# Patient Record
Sex: Female | Born: 1955 | Race: Black or African American | Hispanic: No | Marital: Single | Smoking: Current every day smoker
Health system: Southern US, Community
[De-identification: ages and names within clinical notes are randomized; demographics above are authoritative.]

## PROBLEM LIST (undated history)

## (undated) DIAGNOSIS — J449 Chronic obstructive pulmonary disease, unspecified: Secondary | ICD-10-CM

## (undated) DIAGNOSIS — J45909 Unspecified asthma, uncomplicated: Secondary | ICD-10-CM

## (undated) DIAGNOSIS — M1990 Unspecified osteoarthritis, unspecified site: Secondary | ICD-10-CM

## (undated) DIAGNOSIS — E119 Type 2 diabetes mellitus without complications: Secondary | ICD-10-CM

## (undated) DIAGNOSIS — I639 Cerebral infarction, unspecified: Secondary | ICD-10-CM

## (undated) DIAGNOSIS — I119 Hypertensive heart disease without heart failure: Secondary | ICD-10-CM

## (undated) DIAGNOSIS — I10 Essential (primary) hypertension: Secondary | ICD-10-CM

## (undated) DIAGNOSIS — I509 Heart failure, unspecified: Secondary | ICD-10-CM

## (undated) DIAGNOSIS — I5042 Chronic combined systolic (congestive) and diastolic (congestive) heart failure: Secondary | ICD-10-CM

## (undated) DIAGNOSIS — R0789 Other chest pain: Secondary | ICD-10-CM

## (undated) HISTORY — PX: SP ARTHRO THUMB*R*: HXRAD215

## (undated) HISTORY — PX: TUBAL LIGATION: SHX77

## (undated) HISTORY — PX: CHOLECYSTECTOMY: SHX55

## (undated) HISTORY — PX: DENTAL SURGERY: SHX609

---

## 2015-09-06 ENCOUNTER — Emergency Department (HOSPITAL_COMMUNITY): Payer: Medicaid Other

## 2015-09-06 ENCOUNTER — Observation Stay (HOSPITAL_COMMUNITY)
Admission: EM | Admit: 2015-09-06 | Discharge: 2015-09-07 | Disposition: A | Discharge status: Home / Self Care | Payer: Medicaid Other | Attending: Internal Medicine | Admitting: Internal Medicine

## 2015-09-06 ENCOUNTER — Encounter (HOSPITAL_COMMUNITY): Payer: Self-pay

## 2015-09-06 DIAGNOSIS — F4310 Post-traumatic stress disorder, unspecified: Secondary | ICD-10-CM | POA: Diagnosis not present

## 2015-09-06 DIAGNOSIS — F319 Bipolar disorder, unspecified: Secondary | ICD-10-CM | POA: Diagnosis not present

## 2015-09-06 DIAGNOSIS — R0789 Other chest pain: Secondary | ICD-10-CM | POA: Diagnosis present

## 2015-09-06 DIAGNOSIS — E119 Type 2 diabetes mellitus without complications: Secondary | ICD-10-CM

## 2015-09-06 DIAGNOSIS — Z79899 Other long term (current) drug therapy: Secondary | ICD-10-CM | POA: Insufficient documentation

## 2015-09-06 DIAGNOSIS — I110 Hypertensive heart disease with heart failure: Secondary | ICD-10-CM | POA: Diagnosis not present

## 2015-09-06 DIAGNOSIS — J449 Chronic obstructive pulmonary disease, unspecified: Secondary | ICD-10-CM | POA: Diagnosis not present

## 2015-09-06 DIAGNOSIS — I119 Hypertensive heart disease without heart failure: Secondary | ICD-10-CM | POA: Diagnosis present

## 2015-09-06 DIAGNOSIS — Z66 Do not resuscitate: Secondary | ICD-10-CM | POA: Diagnosis not present

## 2015-09-06 DIAGNOSIS — R079 Chest pain, unspecified: Principal | ICD-10-CM | POA: Diagnosis present

## 2015-09-06 DIAGNOSIS — F17210 Nicotine dependence, cigarettes, uncomplicated: Secondary | ICD-10-CM | POA: Insufficient documentation

## 2015-09-06 DIAGNOSIS — I69354 Hemiplegia and hemiparesis following cerebral infarction affecting left non-dominant side: Secondary | ICD-10-CM | POA: Insufficient documentation

## 2015-09-06 DIAGNOSIS — I428 Other cardiomyopathies: Secondary | ICD-10-CM | POA: Insufficient documentation

## 2015-09-06 DIAGNOSIS — I5042 Chronic combined systolic (congestive) and diastolic (congestive) heart failure: Secondary | ICD-10-CM | POA: Diagnosis present

## 2015-09-06 DIAGNOSIS — J410 Simple chronic bronchitis: Secondary | ICD-10-CM | POA: Diagnosis not present

## 2015-09-06 DIAGNOSIS — Z6838 Body mass index (BMI) 38.0-38.9, adult: Secondary | ICD-10-CM | POA: Diagnosis not present

## 2015-09-06 DIAGNOSIS — Z590 Homelessness: Secondary | ICD-10-CM | POA: Diagnosis not present

## 2015-09-06 DIAGNOSIS — R9431 Abnormal electrocardiogram [ECG] [EKG]: Secondary | ICD-10-CM | POA: Diagnosis present

## 2015-09-06 DIAGNOSIS — Z794 Long term (current) use of insulin: Secondary | ICD-10-CM | POA: Diagnosis not present

## 2015-09-06 HISTORY — DX: Essential (primary) hypertension: I10

## 2015-09-06 HISTORY — DX: Unspecified osteoarthritis, unspecified site: M19.90

## 2015-09-06 HISTORY — DX: Heart failure, unspecified: I50.9

## 2015-09-06 HISTORY — DX: Chronic combined systolic (congestive) and diastolic (congestive) heart failure: I50.42

## 2015-09-06 HISTORY — DX: Hypertensive heart disease without heart failure: I11.9

## 2015-09-06 HISTORY — DX: Unspecified asthma, uncomplicated: J45.909

## 2015-09-06 HISTORY — DX: Chronic obstructive pulmonary disease, unspecified: J44.9

## 2015-09-06 HISTORY — DX: Other chest pain: R07.89

## 2015-09-06 HISTORY — DX: Cerebral infarction, unspecified: I63.9

## 2015-09-06 HISTORY — DX: Type 2 diabetes mellitus without complications: E11.9

## 2015-09-06 LAB — RAPID URINE DRUG SCREEN, HOSP PERFORMED
Amphetamines: NOT DETECTED
Barbiturates: NOT DETECTED
Benzodiazepines: NOT DETECTED
Cocaine: NOT DETECTED
OPIATES: NOT DETECTED
Tetrahydrocannabinol: POSITIVE — AB

## 2015-09-06 LAB — CBC
HEMATOCRIT: 38.1 % (ref 36.0–46.0)
HEMATOCRIT: 37.1 % (ref 36.0–46.0)
Hemoglobin: 12.6 g/dL (ref 12.0–15.0)
Hemoglobin: 12.2 g/dL (ref 12.0–15.0)
MCH: 27.4 pg (ref 26.0–34.0)
MCH: 27.7 pg (ref 26.0–34.0)
MCHC: 33.1 g/dL (ref 30.0–36.0)
MCHC: 32.9 g/dL (ref 30.0–36.0)
MCV: 82.8 fL (ref 78.0–100.0)
MCV: 84.3 fL (ref 78.0–100.0)
Platelets: 267 10*3/uL (ref 150–400)
Platelets: 242 10*3/uL (ref 150–400)
RBC: 4.6 MIL/uL (ref 3.87–5.11)
RBC: 4.4 MIL/uL (ref 3.87–5.11)
RDW: 15 % (ref 11.5–15.5)
RDW: 15.2 % (ref 11.5–15.5)
WBC: 6.8 10*3/uL (ref 4.0–10.5)
WBC: 6.2 10*3/uL (ref 4.0–10.5)

## 2015-09-06 LAB — APTT: APTT: 30 s (ref 24–37)

## 2015-09-06 LAB — BASIC METABOLIC PANEL
ANION GAP: 8 (ref 5–15)
BUN: 13 mg/dL (ref 6–20)
CHLORIDE: 109 mmol/L (ref 101–111)
CO2: 22 mmol/L (ref 22–32)
Calcium: 9.4 mg/dL (ref 8.9–10.3)
Creatinine, Ser: 0.78 mg/dL (ref 0.44–1.00)
Glucose, Bld: 212 mg/dL — ABNORMAL HIGH (ref 65–99)
POTASSIUM: 3.4 mmol/L — AB (ref 3.5–5.1)
SODIUM: 139 mmol/L (ref 135–145)

## 2015-09-06 LAB — GLUCOSE, CAPILLARY: GLUCOSE-CAPILLARY: 165 mg/dL — AB (ref 65–99)

## 2015-09-06 LAB — CREATININE, SERUM
Creatinine, Ser: 1.05 mg/dL — ABNORMAL HIGH (ref 0.44–1.00)
GFR calc non Af Amer: 57 mL/min — ABNORMAL LOW (ref >60)

## 2015-09-06 LAB — BRAIN NATRIURETIC PEPTIDE: B NATRIURETIC PEPTIDE 5: 264.3 pg/mL — AB (ref 0.0–100.0)

## 2015-09-06 LAB — I-STAT TROPONIN, ED: TROPONIN I, POC: 0 ng/mL (ref 0.00–0.08)

## 2015-09-06 LAB — TROPONIN I: Troponin I: <0.03 ng/mL (ref <0.03)

## 2015-09-06 MED ORDER — RISPERIDONE 0.25 MG PO TABS
0.2500 mg | ORAL_TABLET | Freq: Every day | ORAL | Status: DC
  Administered 2015-09-06 – 2015-09-07 (×2): 0.25 mg via ORAL
  Filled 2015-09-06 (×2): qty 1

## 2015-09-06 MED ORDER — ACETAMINOPHEN 325 MG PO TABS: 650.0000 mg | ORAL_TABLET | ORAL | Status: DC | PRN

## 2015-09-06 MED ORDER — ENOXAPARIN SODIUM 40 MG/0.4ML ~~LOC~~ SOLN
40.0000 mg | SUBCUTANEOUS | Status: DC
  Administered 2015-09-06: 40 mg via SUBCUTANEOUS
  Filled 2015-09-06: qty 0.4

## 2015-09-06 MED ORDER — ATORVASTATIN CALCIUM 20 MG PO TABS
20.0000 mg | ORAL_TABLET | Freq: Every evening | ORAL | Status: DC
  Administered 2015-09-06: 20 mg via ORAL
  Filled 2015-09-06: qty 1

## 2015-09-06 MED ORDER — PANTOPRAZOLE SODIUM 40 MG PO TBEC
40.0000 mg | DELAYED_RELEASE_TABLET | Freq: Every day | ORAL | Status: DC
  Administered 2015-09-06 – 2015-09-07 (×2): 40 mg via ORAL
  Filled 2015-09-06 (×2): qty 1

## 2015-09-06 MED ORDER — ACETAMINOPHEN 325 MG PO TABS
650.0000 mg | ORAL_TABLET | Freq: Once | ORAL | Status: AC
  Administered 2015-09-06: 650 mg via ORAL
  Filled 2015-09-06: qty 2

## 2015-09-06 MED ORDER — CARVEDILOL 3.125 MG PO TABS
3.1250 mg | ORAL_TABLET | Freq: Two times a day (BID) | ORAL | Status: DC
  Administered 2015-09-07: 3.125 mg via ORAL
  Filled 2015-09-06: qty 1

## 2015-09-06 MED ORDER — AMITRIPTYLINE HCL 50 MG PO TABS
50.0000 mg | ORAL_TABLET | Freq: Every day | ORAL | Status: DC
  Administered 2015-09-06: 50 mg via ORAL
  Filled 2015-09-06: qty 1

## 2015-09-06 MED ORDER — MECLIZINE HCL 25 MG PO TABS: 25.0000 mg | ORAL_TABLET | Freq: Three times a day (TID) | ORAL | Status: DC | PRN

## 2015-09-06 MED ORDER — AMLODIPINE BESYLATE 5 MG PO TABS
5.0000 mg | ORAL_TABLET | Freq: Every day | ORAL | Status: DC
Start: 2015-09-06 — End: 2015-09-07
  Administered 2015-09-06 – 2015-09-07 (×2): 5 mg via ORAL
  Filled 2015-09-06 (×2): qty 1

## 2015-09-06 MED ORDER — ONDANSETRON HCL 4 MG/2ML IJ SOLN: 4.0000 mg | Freq: Four times a day (QID) | INTRAMUSCULAR | Status: DC | PRN

## 2015-09-06 MED ORDER — FUROSEMIDE 40 MG PO TABS
40.0000 mg | ORAL_TABLET | Freq: Every day | ORAL | Status: DC
  Administered 2015-09-06 – 2015-09-07 (×2): 40 mg via ORAL
  Filled 2015-09-06 (×2): qty 1

## 2015-09-06 MED ORDER — INSULIN ASPART 100 UNIT/ML ~~LOC~~ SOLN
0.0000 [IU] | Freq: Three times a day (TID) | SUBCUTANEOUS | Status: DC
  Administered 2015-09-07: 3 [IU] via SUBCUTANEOUS

## 2015-09-06 MED ORDER — LISINOPRIL 10 MG PO TABS
20.0000 mg | ORAL_TABLET | Freq: Every day | ORAL | Status: DC
Start: 2015-09-06 — End: 2015-09-07
  Administered 2015-09-06 – 2015-09-07 (×2): 20 mg via ORAL
  Filled 2015-09-06 (×2): qty 2

## 2015-09-06 MED ORDER — ALBUTEROL SULFATE (2.5 MG/3ML) 0.083% IN NEBU: 2.5000 mg | INHALATION_SOLUTION | Freq: Four times a day (QID) | RESPIRATORY_TRACT | Status: DC | PRN

## 2015-09-06 MED ORDER — INSULIN DETEMIR 100 UNIT/ML ~~LOC~~ SOLN
20.0000 [IU] | Freq: Every day | SUBCUTANEOUS | Status: DC
  Administered 2015-09-06: 20 [IU] via SUBCUTANEOUS
  Filled 2015-09-06 (×2): qty 0.2

## 2015-09-06 MED ORDER — INSULIN ASPART 100 UNIT/ML ~~LOC~~ SOLN: 0.0000 [IU] | Freq: Every day | SUBCUTANEOUS | Status: DC

## 2015-09-06 MED ORDER — GLIPIZIDE 5 MG PO TABS
5.0000 mg | ORAL_TABLET | Freq: Every day | ORAL | Status: DC
  Administered 2015-09-07: 5 mg via ORAL
  Filled 2015-09-06: qty 1

## 2015-09-06 MED ORDER — MONTELUKAST SODIUM 10 MG PO TABS
10.0000 mg | ORAL_TABLET | Freq: Every day | ORAL | Status: DC
Start: 2015-09-06 — End: 2015-09-07
  Administered 2015-09-06: 10 mg via ORAL
  Filled 2015-09-06: qty 1

## 2015-09-06 MED ORDER — TRAMADOL HCL 50 MG PO TABS
100.0000 mg | ORAL_TABLET | Freq: Four times a day (QID) | ORAL | Status: DC | PRN
  Administered 2015-09-06 – 2015-09-07 (×2): 100 mg via ORAL
  Filled 2015-09-06 (×2): qty 2

## 2015-09-06 MED ORDER — POTASSIUM CHLORIDE CRYS ER 20 MEQ PO TBCR
40.0000 meq | EXTENDED_RELEASE_TABLET | Freq: Once | ORAL | Status: AC
  Administered 2015-09-06: 40 meq via ORAL
  Filled 2015-09-06: qty 2

## 2015-09-06 MED ORDER — PAROXETINE HCL 20 MG PO TABS
20.0000 mg | ORAL_TABLET | Freq: Every day | ORAL | Status: DC
Start: 2015-09-06 — End: 2015-09-07
  Administered 2015-09-06 – 2015-09-07 (×2): 20 mg via ORAL
  Filled 2015-09-06 (×2): qty 1

## 2015-09-06 NOTE — Progress Notes (Addendum)
Medicaid [place] access response hx indicates the assigned pcp is [facility] [address] [place], [place] [address] [phone] Entered in d/c instructions  [facility] Schedule an appointment as soon as possible for a visit As needed This is your assigned Medicaid [place] access doctor If you prefer to see another Medicaid doctor other than the one on your Medicaid card PLEASE CALL DSS [phone] or [phone] Medicaid  access response hx indicates the assigned pcp is [facility] [address] [place], [place] [address] [phone]  Sandi Mariscal access patient [place]: [phone] [address] [place], [place] [address] [email or web address] Use this website to assist with understanding your coverage & to renew application As a Medicaid client you MUST contact DSS/SSI each time you change address, move to another [place] county or another state to keep your address updated  [place] Medicaid Transportation to Dr appts if you are have full Medicaid: [phone], [phone]

## 2015-09-06 NOTE — Progress Notes (Signed)
CSW spoke with pt re: homelessness issues.  Per pt, she has been in contact with her [facility] Case Manager, Herbert Seta, who has referred her to a "transitional housing shelter" operated by [employer], and that a bed is available to her at d/c.  Per pt, she plans on securing her own rental property next month, and only needs somewhere to stay for "a couple of weeks." If Mr. Laverle Patter does not have a bed for pt at d/c, she will gladly accept housing at a homeless shelter.  Shelter list provided. CSW will continue to follow for disposition and assist as necessary.  Emotional support provided to pt.

## 2015-09-06 NOTE — Progress Notes (Signed)
Patient received from ED and oriented to room and equipment. Tele monitoring called in, call light within reach

## 2015-09-06 NOTE — ED Notes (Signed)
Admitting provider at bedside, pt is having pain, pain is reproduced with movement of arms and movement with body, admitting provider deemed pain was musculoskeletal.

## 2015-09-06 NOTE — Consult Note (Signed)
CARDIOLOGY CONSULT NOTE   Patient ID: Kayla Powell MRN: [id number], DOB/AGE: 1955/08/08   Admit date: 09/06/2015 Date of Consult: 09/06/2015   Primary Physician: she has not been set up with a PCP since moved to [facility] 2 weeks ago Primary Cardiologist: new   Pt. 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 COPD (chronic obstructive pulmonary disease) (HCC)   . Stroke (HCC)   . Diabetes mellitus without complication (HCC)   . Hypertension   . CHF (congestive heart failure) (HCC)   . Arthritis   . Asthma     Past Surgical History  Procedure Laterality Date  . Sp arthro thumb*r*    . Cholecystectomy    . Tubal ligation    . Dental surgery       Allergies  Allergies  Allergen Reactions  . 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 Per outside record, she had a cardiac catheterization in April 2016 that showed normal coronaries. She thinks she had may have had a stroke back in September 2016, however did not seek medical attention at that time, she was diagnosed with a second stroke in November 2016 and has been having decreased sensation and paralysis of the left upper and lower extremity since then. She has been admitted 4 times in the past year at [facility] in [place]. She had a history of cocaine abuse which she says she has quit and has been clean for the past 2 month.   Her most recent admission at [facility] was in March and May for acute on chronic systolic heart failure. She is currently on 40 mg  daily oral Lasix. During both hospitalization, she had CTA scan both times for falsely elevated d-dimer, and both CTA of chest (on 3/20 and 5/11) were negative. Echocardiogram obtained in March 2017 showed EF 30%, moderate pulmonary hypertension, mild MR. She did have a myocardial perfusion stress test on 07/06/2015 which showed EF 11%, overall poor study, cannot exclude ischemia in the apical septal and anteroseptal region. During his hospitalization in May, her initial proBNP was > 6000, apparently she also had a repeat echocardiogram on 5/17 which showed EF 30%, although such record was not available. She was complaining of chest pain as a time and was instructed to avoid cocaine. He was treated with diuretic and also bacteremia. Blood culture was positive for Bordetella in one set and Corynebacter in second set of blood culture. She also had EGD with biopsy that tested positive for candidiasis of esophagus. She was supposed to followup with cardiology as outside, but she says "they never set me up with a cardiologist".  She says she has been compliant with her medication, she moved to the [place] area 2 weeks ago to be closer to her son. She is adamant she has stopped using cocaine for the past 2 month. She also has been cutting back on smoking, currently only smokes 3-4 cigarettes per day compared to a pack a day earlier this year. She was  doing well until 3 days ago when she started having substernal chest discomfort which she described as a constant dull pressure sensation like someone was jumping up and down on her chest. It is accompanied by shortness of breath. She is also noticed increasing lower extremity edema. She finally decided to seek medical attention today. Chest x-ray was negative. BNP was 264.3. Serial troponin was negative 2. Potassium 3.4. Cardiology has been consulted for chest pain. On physical exam, her chest pain is worse with palpation and body rotation especially when she is leaning  on her right side.   Family History Family History  Problem Relation Age of Onset  . Heart failure Mother   . Hypertension Mother   . Cancer Mother   . Cancer Father   . Hypertension Father   . Stroke Brother   . Diabetes Brother   . Heart failure Brother      Social History Social History   Social History  . Marital Status: Single    Spouse Name: N/A  . Number of Children: N/A  . Years of Education: N/A   Occupational History  . Not on file.   Social History Main Topics  . Smoking status: Current Every Day Smoker    Types: Cigarettes  . Smokeless tobacco: Not on file  . Alcohol Use: Yes     Comment: occasional  . Drug Use: Yes    Special: Marijuana  . Sexual Activity: Not on file   Other Topics Concern  . Not on file   Social History Narrative  . No narrative on file     Review of Systems  General:  No chills, fever, night sweats or weight changes.  Cardiovascular:  No orthopnea, palpitations, paroxysmal nocturnal dyspnea. +chest pain, dyspnea on exertion, edema Dermatological: No rash, lesions/masses Respiratory: No cough +dyspnea Urologic: No hematuria, dysuria Abdominal:   No nausea, vomiting, diarrhea, bright red blood per rectum, melena, or hematemesis Neurologic:  No visual changes, wkns, changes in mental status. All other systems reviewed and are otherwise negative except as noted above.  Physical Exam  Blood pressure 119/78, pulse 77, resp. rate 19, SpO2 100 %.  General: Pleasant, NAD Psych: Normal affect. Neuro: Alert and oriented X 3. Moves all extremities spontaneously. HEENT: Normal  Neck: Supple without bruits or JVD. Lungs:  Resp regular and unlabored, CTA. Heart: RRR no s3, s4, or murmurs. Abdomen: Soft, non-tender, non-distended, BS + x 4.  Extremities: No clubbing, cyanosis or edema. DP/PT/Radials 2+ and equal bilaterally.  Labs   Recent Labs  09/06/15 1355  TROPONINI <0.03   Lab Results  Component Value Date   WBC 6.8  09/06/2015   HGB 12.6 09/06/2015   HCT 38.1 09/06/2015   MCV 82.8 09/06/2015   PLT 267 09/06/2015    Recent Labs Lab 09/06/15 1000  NA 139  K 3.4*  CL 109  CO2 22  BUN 13  CREATININE 0.78  CALCIUM 9.4  GLUCOSE 212*   No results found for: CHOL, HDL, LDLCALC, TRIG No results found for: DDIMER  Radiology/Studies  Dg Chest 2 View  09/06/2015  CLINICAL DATA:  Chest pain, cold chills since Tuesday EXAM: CHEST  2 VIEW COMPARISON:  None. FINDINGS: Cardiomegaly. The lingular density, likely atelectasis or scarring. Right lung is clear. No visible effusions. No acute bony abnormality. Degenerative changes in the thoracic spine. IMPRESSION: Cardiomegaly.  Lingular atelectasis or scarring. Electronically Signed   By: Charlett Nose M.D.   On: 09/06/2015 10:54    ECG  NSR with incomplete LBBB  ASSESSMENT AND PLAN  1. Chest pain: relatively lower suspicion for cardiac chest pain, although she did have a case her candidiasis of esophagus on EGD in May. Her pain also worse with palpation and body rotation  - she has 2 admission in March and May, both had falsely elevated d-dimer and negative CTA of chest. D-dimer will not be helpful  - will set this patient up with cardiology service here  2. NICM/Chronic systolic HF with baseline EF 30%: BNP minimally elevated > 200, but her proBNP was > 6000 on previous admission in May  - despite her claim that she is fluid overloaded, i do not see it on physical exam, she appears euvolemic  3. HTN  4. DM II  5. COPD  6. 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 COPD, no sign of acute exacerbation  8. h/o cocaine use: she is adamant she has quit cocaine for the past 2 month, only marijuana use   Signed, Azalee Course, PA-C 09/06/2015, 3:48 PM

## 2015-09-06 NOTE — ED Notes (Signed)
Ordered diet tray 

## 2015-09-06 NOTE — ED Provider Notes (Signed)
CSN: [id number]     Arrival date & time 09/06/15  0944 History   First MD Initiated Contact with Patient 09/06/15 1015     Chief Complaint  Patient presents with  . Chest Pain     (Consider location/radiation/quality/duration/timing/severity/associated sxs/prior Treatment) HPI Comments: Kayla Powell is a 60 y.o. female with h/o T2DM, COPD, HTN, CHF, asthma, PTSD, and CVA with residual partial left sided paralysis presents to ED via EMS with complaint of chest pain. Pain is left sided and started approximately 3 days ago and has progressively intensified. Pain 10/10 initially. She was given 324 ASA and 2 NTG with improvement in pain to a 7/10. She describes it as a dull, constant throbbing with intermittent sharp pain. No radiation. Worse when she lays on her right side stating "I can't breathe" and improved when laying on left side.  Associated shortness of breath, dry cough, leg swelling, palpitations, dizziness, nausea, diaphoresis, and chills. Denies history of MI. No recent long distance travel/surgery/immobilization, no h/o cancer, no hemoptysis, no leg pain, no unilateral leg swelling. She is not on home O2. Endorses taking her medications as prescribed and denies dietary indiscretion. Last hospitalization was in march 2017 for similar sxs. She received most of her care in [facility].   Patient is a 60 y.o. female presenting with chest pain. The history is provided by the patient.  Chest Pain Associated symptoms: cough, diaphoresis, dizziness, nausea, numbness ( chronic left sided partial paralysis), palpitations, shortness of breath and weakness ( chronic, partial left sided partial paralysis )   Associated symptoms: no abdominal pain, no fever and not vomiting     Past Medical History  Diagnosis Date  . COPD (chronic obstructive pulmonary disease) (HCC)   . Stroke (HCC)   . Diabetes mellitus without complication (HCC)   . Hypertension   . CHF (congestive heart failure) (HCC)   .  Arthritis   . Asthma   . Chronic combined systolic and diastolic heart failure (HCC) 09/06/2015  . Hypertensive heart disease 09/06/2015  . Atypical chest pain 09/06/2015   Past Surgical History  Procedure Laterality Date  . Sp arthro thumb*r*    . Cholecystectomy    . Tubal ligation    . Dental surgery     Family History  Problem Relation Age of Onset  . Heart failure Mother   . Hypertension Mother   . Cancer Mother   . Cancer Father   . Hypertension Father   . Stroke Brother   . Diabetes Brother   . Heart failure Brother    Social History  Substance Use Topics  . Smoking status: Current Every Day Smoker    Types: Cigarettes  . Smokeless tobacco: None  . Alcohol Use: Yes     Comment: occasional   OB History    No data available     Review of Systems  Constitutional: Positive for chills and diaphoresis. Negative for fever.  HENT: Positive for sore throat.   Respiratory: Positive for cough and shortness of breath. Choking:  dry.   Cardiovascular: Positive for chest pain, palpitations and leg swelling ( b/l).  Gastrointestinal: Positive for nausea and constipation. Negative for vomiting, abdominal pain and diarrhea.  Genitourinary: Negative for dysuria and hematuria.  Musculoskeletal: Positive for arthralgias ( h/o arthritis).  Skin: Negative for rash.  Neurological: Positive for dizziness, tremors, weakness ( chronic, partial left sided partial paralysis ) and numbness ( chronic left sided partial paralysis). Negative for seizures.  Psychiatric/Behavioral: The patient is not  nervous/anxious.       Allergies  Ppd  Home Medications   Prior to Admission medications   Medication Sig Start Date End Date Taking? Authorizing Provider  albuterol (PROVENTIL HFA;VENTOLIN HFA) 108 (90 Base) MCG/ACT inhaler Inhale 1-2 puffs into the lungs every 6 (six) hours as needed for wheezing or shortness of breath.   Yes Historical Provider, MD  amitriptyline (ELAVIL) 50 MG tablet  Take 50 mg by mouth at bedtime.   Yes Historical Provider, MD  amLODipine (NORVASC) 5 MG tablet Take 5 mg by mouth daily.   Yes Historical Provider, MD  atorvastatin (LIPITOR) 20 MG tablet Take 20 mg by mouth every evening.   Yes Historical Provider, MD  baclofen (LIORESAL) 20 MG tablet Take 20 mg by mouth 3 (three) times daily.   Yes Historical Provider, MD  carvedilol (COREG) 3.125 MG tablet Take 3.125 mg by mouth 2 (two) times daily with a meal.   Yes Historical Provider, MD  furosemide (LASIX) 40 MG tablet Take 40 mg by mouth daily.   Yes Historical Provider, MD  glipiZIDE (GLUCOTROL) 5 MG tablet Take 5 mg by mouth daily before breakfast.   Yes Historical Provider, MD  insulin detemir (LEVEMIR) 100 UNIT/ML injection Inject 20 Units into the skin at bedtime.   Yes Historical Provider, MD  lisinopril (PRINIVIL,ZESTRIL) 20 MG tablet Take 20 mg by mouth daily.   Yes Historical Provider, MD  meclizine (ANTIVERT) 25 MG tablet Take 25 mg by mouth 3 (three) times daily as needed for dizziness.   Yes Historical Provider, MD  montelukast (SINGULAIR) 10 MG tablet Take 10 mg by mouth at bedtime.   Yes Historical Provider, MD  pantoprazole (PROTONIX) 40 MG tablet Take 40 mg by mouth daily.   Yes Historical Provider, MD  PARoxetine (PAXIL) 20 MG tablet Take 20 mg by mouth daily.   Yes Historical Provider, MD  risperiDONE (RISPERDAL) 0.25 MG tablet Take 0.25 mg by mouth daily.   Yes Historical Provider, MD   BP 114/69 mmHg  Pulse 75  Resp 24  SpO2 100% Physical Exam  Constitutional: She appears well-developed and well-nourished. She appears distressed.  HENT:  Head: Normocephalic and atraumatic.  Mouth/Throat: Uvula is midline and oropharynx is clear and moist. No trismus in the jaw. No uvula swelling. No oropharyngeal exudate.  Eyes: Conjunctivae and EOM are normal. Pupils are equal, round, and reactive to light. Right eye exhibits no discharge. Left eye exhibits no discharge. No scleral icterus.   Neck: Phonation normal. Neck supple. No JVD present. Decreased range of motion present.  Cardiovascular: Normal rate, regular rhythm, normal heart sounds and intact distal pulses.   No murmur heard. Pulmonary/Chest: No accessory muscle usage or stridor. Tachypnea noted. No respiratory distress. She has decreased breath sounds ( diffuse). She has no wheezes. She has no rhonchi. She has no rales. She exhibits no tenderness and no bony tenderness.  Abdominal: Soft. Bowel sounds are normal. There is no tenderness. There is no rebound and no guarding.  Musculoskeletal: She exhibits no edema or tenderness.  Left sided upper extremity contractures.   Lymphadenopathy:    She has no cervical adenopathy.  Neurological: She is alert.  Decrease strength in left lower extremity, per pt chronic secondary to partial paralysis.   Skin: Skin is warm and dry. She is not diaphoretic.  Psychiatric: She has a normal mood and affect. Her behavior is normal.    ED Course  Procedures (including critical care time) Labs Review Labs Reviewed  BASIC  METABOLIC PANEL - Abnormal; Notable for the following:    Potassium 3.4 (*)    Glucose, Bld 212 (*)    All other components within normal limits  BRAIN NATRIURETIC PEPTIDE - Abnormal; Notable for the following:    B Natriuretic Peptide 264.3 (*)    All other components within normal limits  APTT  CBC  TROPONIN I  I-STAT TROPOININ, ED    Imaging Review Dg Chest 2 View  09/06/2015  CLINICAL DATA:  Chest pain, cold chills since Tuesday EXAM: CHEST  2 VIEW COMPARISON:  None. FINDINGS: Cardiomegaly. The lingular density, likely atelectasis or scarring. Right lung is clear. No visible effusions. No acute bony abnormality. Degenerative changes in the thoracic spine. IMPRESSION: Cardiomegaly.  Lingular atelectasis or scarring. Electronically Signed   By: Charlett Nose M.D.   On: 09/06/2015 10:54   I have personally reviewed and evaluated these images and lab results as  part of my medical decision-making.   EKG Interpretation   Date/Time:  Friday September 06 2015 09:52:48 EDT Ventricular Rate:  77 PR Interval:    QRS Duration: 149 QT Interval:  484 QTC Calculation: 548 R Axis:   -81 Text Interpretation:  Sinus rhythm Probable left atrial enlargement  Nonspecific IVCD with LAD Left ventricular hypertrophy No previous tracing  Confirmed by BEATON  MD, ROBERT ([id number]) on 09/06/2015 10:27:45 AM     Filed Vitals:   09/06/15 1900 09/06/15 1917 09/06/15 1931 09/06/15 1931  BP: 114/72 105/76 112/70   Pulse:  77 72   Temp:    97.6 F (36.4 C)  TempSrc:    Oral  Resp: 24 16 17    SpO2:  100% 100%     MDM   Final diagnoses:  None   Patient is afebrile and non-toxic appearing in NAD. She is tachypneic, vital signs otherwise stable. Physical exam remarkable for decrease breath sounds diffuse. Received records from [place] [facility]. Pt recently had cardiac work up in March and May 2017. Echo showed EF ~30% with evidence of mild pulmonary HTN. Nuclear stress test in May 2017 showed global left ventricular systolic function with reduced EF of 11%. EKG shows sinus rhythm with left atrial enlargement and LVH. Initial troponin negative. CXR remarkable for cardiomegaly, lingular density - question of atelectasis vs. Scarring. BMP, CBC, aPTT unremarkable.  BNP mildly elevated at 264; however, compared to previous admission of BNP >5000 - questionable whether this is CHF exacerbation. Given history and heart score of 6, consult to cardiology.   2:10 PM: Consult to cardiology, appreciate their time. Agree to see patient.   ~5:00 PM: Spoke with Azalee Course, cardiology PA, appreciated his time and input. Further records show normal cardiac cath in 2016. Await attending input for dispo.   6:09 PM: Spoke with Nada Boozer, NP cardiology, appreciated her time. Consult to hospitalist for admission for cardiac r/o.   7:27 PM: Spoke with [facility], appreciate their time and input. Will admit  patient for cardiac rule out.   Lona Kettle, [place] 09/06/15 1935  Nelva Nay, MD 09/08/15 [phone]

## 2015-09-06 NOTE — ED Notes (Signed)
Pt also c/o shaking at left upper and lower extremities. Shaking intermittent but pt states this is new.

## 2015-09-06 NOTE — ED Notes (Signed)
Pt staes she is feeling better and no tremor is noted at left side of bottom.

## 2015-09-06 NOTE — ED Notes (Addendum)
Pt arrives EMS with c/o chest pain after walking longer diastance. Pt states chest pain stated 3 days ago but milder. Also c/o shob. Given 2 nitro  And aspirin 324 by EMS.

## 2015-09-06 NOTE — H&P (Signed)
Kayla Powell:[id number] DOB: 1955/03/15 DOA: 09/06/2015     PCP: PROVIDER NOT IN SYSTEM  Recently moved from [facility] Outpatient Specialists: none   Patient coming from: homeless    Chief Complaint: chest pain  HPI: Kayla Powell is a 60 y.o. female with medical history significant of asthma/COPD, bipolar disorder, combined systolic and diastolic heart failure, depression, diabetes mellitus type 2, hypertension posttraumatic stress disorder and stroke 2 with residual left-sided paralysis  Left bundle-branch block, cocaine abuse  Presented with chest pain after walking for long distance. She have had similar episode 3 days ago but there was mild. She called EMS and was given 2 nitroglycerin and aspirin 324 in  Route. Pain was associated shortness of breath and coughing she describes it as dull and constant occasionally throb and sometimes becomes sharp. Chest pain is in the center. Reports pain is worse with palpation and worse with moving her left arm Reports last cocaine use 2 months ago.  Patient recently moved here from [facility]. She is currently homeless   Regarding pertinent Chronic problems: According to the records from [facility] patient had a cardiac catheterization in April 2016 EF was 40% there is global hypokinesis significant mitral regurgitation normal coronary arteries elevated left ventricular end-diastolic pressure at 22 mmHg Echogram in April 2016 showed EF of 30-35 percent   IN ER: Afebrile heart rate 72 respirations after 33 but now down to 17 blood pressure 112/70 700% room air WBC 6.8 hemoglobin 12.6 potassium 3.4 creatinine 0.78 glucose 212 troponin less than 0.03 Chest x-ray showing cardiomegaly  Cardiology consult has been called and recommended admission for chest pain workup    Hospitalist was called for admission for chest pain  Review of Systems:    Pertinent positives include: chest pain, shortness of breath at rest. dyspnea on  exertion,  Constitutional:  No weight loss, night sweats, Fevers, chills, fatigue, weight loss  HEENT:  No headaches, Difficulty swallowing,Tooth/dental problems,Sore throat,  No sneezing, itching, ear ache, nasal congestion, post nasal drip,  Cardio-vascular:  No  Orthopnea, PND, anasarca, dizziness, palpitations.no Bilateral lower extremity swelling  GI:  No heartburn, indigestion, abdominal pain, nausea, vomiting, diarrhea, change in bowel habits, loss of appetite, melena, blood in stool, hematemesis Resp:   No  No excess mucus, no productive cough, No non-productive cough, No coughing up of blood.No change in color of mucus.No wheezing. Skin:  no rash or lesions. No jaundice GU:  no dysuria, change in color of urine, no urgency or frequency. No straining to urinate.  No flank pain.  Musculoskeletal:  No joint pain or no joint swelling. No decreased range of motion. No back pain.  Psych:  No change in mood or affect. No depression or anxiety. No memory loss.  Neuro: no localizing neurological complaints, no tingling, no weakness, no double vision, no gait abnormality, no slurred speech, no confusion  As per HPI otherwise 10 point review of systems negative.   Past Medical History: Past Medical History  Diagnosis Date  . COPD (chronic obstructive pulmonary disease) (HCC)   . Stroke (HCC)   . Diabetes mellitus without complication (HCC)   . Hypertension   . CHF (congestive heart failure) (HCC)   . Arthritis   . Asthma   . Chronic combined systolic and diastolic heart failure (HCC) 09/06/2015  . Hypertensive heart disease 09/06/2015  . Atypical chest pain 09/06/2015   Past Surgical History  Procedure Laterality Date  . Sp arthro thumb*r*    .  Cholecystectomy    . Tubal ligation    . Dental surgery       Social History:  Ambulatory independently     reports that she has been smoking Cigarettes.  She does not have any smokeless tobacco history on file. She reports  that she drinks alcohol. She reports that she uses illicit drugs (Marijuana).  Allergies:   Allergies  Allergen Reactions  . Ppd [Tuberculin Purified Protein Derivative] Rash     Family History:    Family History  Problem Relation Age of Onset  . Heart failure Mother   . Hypertension Mother   . Cancer Mother   . Cancer Father   . Hypertension Father   . Stroke Brother   . Diabetes Brother   . Heart failure Brother     Medications: Prior to Admission medications   Medication Sig Start Date End Date Taking? Authorizing Provider  albuterol (PROVENTIL HFA;VENTOLIN HFA) 108 (90 Base) MCG/ACT inhaler Inhale 1-2 puffs into the lungs every 6 (six) hours as needed for wheezing or shortness of breath.   Yes Historical Provider, MD  amitriptyline (ELAVIL) 50 MG tablet Take 50 mg by mouth at bedtime.   Yes Historical Provider, MD  amLODipine (NORVASC) 5 MG tablet Take 5 mg by mouth daily.   Yes Historical Provider, MD  atorvastatin (LIPITOR) 20 MG tablet Take 20 mg by mouth every evening.   Yes Historical Provider, MD  baclofen (LIORESAL) 20 MG tablet Take 20 mg by mouth 3 (three) times daily.   Yes Historical Provider, MD  carvedilol (COREG) 3.125 MG tablet Take 3.125 mg by mouth 2 (two) times daily with a meal.   Yes Historical Provider, MD  furosemide (LASIX) 40 MG tablet Take 40 mg by mouth daily.   Yes Historical Provider, MD  glipiZIDE (GLUCOTROL) 5 MG tablet Take 5 mg by mouth daily before breakfast.   Yes Historical Provider, MD  insulin detemir (LEVEMIR) 100 UNIT/ML injection Inject 20 Units into the skin at bedtime.   Yes Historical Provider, MD  lisinopril (PRINIVIL,ZESTRIL) 20 MG tablet Take 20 mg by mouth daily.   Yes Historical Provider, MD  meclizine (ANTIVERT) 25 MG tablet Take 25 mg by mouth 3 (three) times daily as needed for dizziness.   Yes Historical Provider, MD  montelukast (SINGULAIR) 10 MG tablet Take 10 mg by mouth at bedtime.   Yes Historical Provider, MD   pantoprazole (PROTONIX) 40 MG tablet Take 40 mg by mouth daily.   Yes Historical Provider, MD  PARoxetine (PAXIL) 20 MG tablet Take 20 mg by mouth daily.   Yes Historical Provider, MD  risperiDONE (RISPERDAL) 0.25 MG tablet Take 0.25 mg by mouth daily.   Yes Historical Provider, MD    Physical Exam: Patient Vitals for the past 24 hrs:  BP Temp Temp src Pulse Resp SpO2  09/06/15 1931 - 97.6 F (36.4 C) Oral - - -  09/06/15 1931 112/70 mmHg - - 72 17 100 %  09/06/15 1917 105/76 mmHg - - 77 16 100 %  09/06/15 1900 114/72 mmHg - - - 24 -  09/06/15 1800 119/77 mmHg - - - 21 99 %  09/06/15 1730 101/85 mmHg - - 79 21 100 %  09/06/15 1700 138/94 mmHg - - 82 19 100 %  09/06/15 1645 128/88 mmHg - - 82 24 99 %  09/06/15 1630 (!) 132/109 mmHg - - 83 15 100 %  09/06/15 1615 142/92 mmHg - - 80 20 100 %  09/06/15 1600  138/62 mmHg - - 79 22 100 %  09/06/15 1545 (!) 139/107 mmHg - - 81 20 100 %  09/06/15 1530 151/88 mmHg - - 83 18 100 %  09/06/15 1500 (!) 139/112 mmHg - - 80 20 100 %  09/06/15 1445 137/92 mmHg - - 76 18 100 %  09/06/15 1415 115/67 mmHg - - 76 (!) 33 100 %  09/06/15 1413 119/78 mmHg - - 77 19 100 %  09/06/15 1400 119/78 mmHg - - 74 20 100 %  09/06/15 1338 113/63 mmHg - - 74 20 100 %  09/06/15 1330 113/63 mmHg - - 74 20 99 %  09/06/15 1315 (!) 119/53 mmHg - - 74 14 98 %  09/06/15 1300 115/63 mmHg - - 75 14 97 %  09/06/15 1245 122/65 mmHg - - 80 14 98 %  09/06/15 1230 125/79 mmHg - - 81 14 97 %  09/06/15 1215 130/80 mmHg - - 77 15 97 %  09/06/15 1200 133/83 mmHg - - 76 15 100 %  09/06/15 1145 125/88 mmHg - - 80 22 100 %  09/06/15 1130 134/81 mmHg - - 78 13 97 %  09/06/15 1115 131/82 mmHg - - 78 18 99 %  09/06/15 1101 114/70 mmHg - - (!) 57 16 100 %  09/06/15 1100 121/82 mmHg - - 70 22 100 %  09/06/15 1030 124/78 mmHg - - 74 24 100 %  09/06/15 1015 110/84 mmHg - - 81 19 100 %  09/06/15 1000 114/69 mmHg - - 75 24 100 %    1. General:  in No Acute distress 2. Psychological:  Alert and  Oriented 3. Head/ENT:   Moist   Mucous Membranes                          Head Non traumatic, neck supple                          Normal  Dentition 4. SKIN: normal  Skin turgor,  Skin clean Dry and intact no rash 5. Heart: Regular rate and rhythm diastolic Murmur, Rub or gallop 6. Lungs:   no wheezes or crackles   7. Abdomen: Soft, non-tender, Non distended 8. Lower extremities: no clubbing, cyanosis, or edema 9. Neurologically Grossly intact, moving all 4 extremities equally 10. MSK: Normal range of motion   body mass index is unknown because there is no height or weight on file.  Labs on Admission:   Labs on Admission: I have personally reviewed following labs and imaging studies  CBC:  Recent Labs Lab 09/06/15 1000  WBC 6.8  HGB 12.6  HCT 38.1  MCV 82.8  PLT 267   Basic Metabolic Panel:  Recent Labs Lab 09/06/15 1000  NA 139  K 3.4*  CL 109  CO2 22  GLUCOSE 212*  BUN 13  CREATININE 0.78  CALCIUM 9.4   GFR: CrCl cannot be calculated (Unknown ideal weight.). Liver Function Tests: No results for input(s): AST, ALT, ALKPHOS, BILITOT, PROT, ALBUMIN in the last 168 hours. No results for input(s): LIPASE, AMYLASE in the last 168 hours. No results for input(s): AMMONIA in the last 168 hours. Coagulation Profile: No results for input(s): INR, PROTIME in the last 168 hours. Cardiac Enzymes:  Recent Labs Lab 09/06/15 1355  TROPONINI <0.03   BNP (last 3 results) No results for input(s): PROBNP in the last 8760 hours. HbA1C: No results for input(s):  HGBA1C in the last 72 hours. CBG: No results for input(s): GLUCAP in the last 168 hours. Lipid Profile: No results for input(s): CHOL, HDL, LDLCALC, TRIG, CHOLHDL, LDLDIRECT in the last 72 hours. Thyroid Function Tests: No results for input(s): TSH, T4TOTAL, FREET4, T3FREE, THYROIDAB in the last 72 hours. Anemia Panel: No results for input(s): VITAMINB12, FOLATE, FERRITIN, TIBC, IRON, RETICCTPCT in  the last 72 hours. Urine analysis: No results found for: COLORURINE, APPEARANCEUR, LABSPEC, PHURINE, GLUCOSEU, HGBUR, BILIRUBINUR, KETONESUR, PROTEINUR, UROBILINOGEN, NITRITE, LEUKOCYTESUR Sepsis Labs: @LABRCNTIP (procalcitonin:4,lacticidven:4) )No results found for this or any previous visit (from the past 240 hour(s)).     UA ordered  No results found for: HGBA1C  CrCl cannot be calculated (Unknown ideal weight.).  BNP (last 3 results) No results for input(s): PROBNP in the last 8760 hours.   ECG REPORT  Independently reviewed Rate:77  Rhythm: IVCD ST&T Change: No acute ischemic changes   QTC 548   There were no vitals filed for this visit.   Cultures: No results found for: SDES, SPECREQUEST, CULT, REPTSTATUS   Radiological Exams on Admission: Dg Chest 2 View  09/06/2015  CLINICAL DATA:  Chest pain, cold chills since Tuesday EXAM: CHEST  2 VIEW COMPARISON:  None. FINDINGS: Cardiomegaly. The lingular density, likely atelectasis or scarring. Right lung is clear. No visible effusions. No acute bony abnormality. Degenerative changes in the thoracic spine. IMPRESSION: Cardiomegaly.  Lingular atelectasis or scarring. Electronically Signed   By: Charlett Nose M.D.   On: 09/06/2015 10:54    Chart has been reviewed    Assessment/Plan  60 y.o. female with medical history significant of asthma/COPD, bipolar disorder, combined systolic and diastolic heart failure, depression, diabetes mellitus type 2, hypertension posttraumatic stress disorder and stroke 2 with residual left-sided paralysis ,  Left bundle-branch block, cocaine abuse here with chest pain appears musculoskeletal in origin Present on Admission:  . Chest pain - appears to musculoskeletal,  given risk factors will admit, monitor on telemetry, cycle cardiac enzymes, obtain serial ECG. Further risk stratify with lipid panel, hgA1C, obtain TSH. Make sure patient is on Aspirin. Further treatment based on the currently pending  results.  . Chronic combined systolic and diastolic heart failure (HCC) - continue Lasix currently appears to be euvolemic  . Hypertensive heart disease -stable continue home medications  . COPD (chronic obstructive pulmonary disease) (HCC) stable continue home medications History of cocaine states last episode was 2 months ago. We will obtain a urine tox screen Other plan as per orders.  DVT prophylaxis:    Lovenox     Code Status:    DNR/DNI  as per patient    Family Communication:   Family not  at  Bedside     Disposition Plan:     To home once workup is complete and patient is stable   Consults called: cardiology consulted  Admission status:   obs    Level of care     tele         I have spent a total of 55 min on this admission    Amelie Caracci 09/06/2015, 8:54 PM    [facility]  Pager [phone]   after 2 AM please page floor coverage PA If 7AM-7PM, please contact the day team taking care of the patient  [email or web address]  Password [email or web address]

## 2015-09-06 NOTE — ED Notes (Signed)
Confirmation of fax, reguest of medical records, came through @ 1205.

## 2015-09-06 NOTE — ED Notes (Signed)
[place] sandwich provided to pt per PA

## 2015-09-07 DIAGNOSIS — J410 Simple chronic bronchitis: Secondary | ICD-10-CM | POA: Diagnosis not present

## 2015-09-07 DIAGNOSIS — E119 Type 2 diabetes mellitus without complications: Secondary | ICD-10-CM | POA: Diagnosis not present

## 2015-09-07 DIAGNOSIS — I4581 Long QT syndrome: Secondary | ICD-10-CM

## 2015-09-07 DIAGNOSIS — Z794 Long term (current) use of insulin: Secondary | ICD-10-CM

## 2015-09-07 DIAGNOSIS — I5042 Chronic combined systolic (congestive) and diastolic (congestive) heart failure: Secondary | ICD-10-CM | POA: Diagnosis not present

## 2015-09-07 DIAGNOSIS — R0789 Other chest pain: Secondary | ICD-10-CM | POA: Diagnosis not present

## 2015-09-07 LAB — TROPONIN I
Troponin I: <0.03 ng/mL (ref <0.03)
Troponin I: 0.03 ng/mL (ref <0.03)

## 2015-09-07 LAB — GLUCOSE, CAPILLARY
GLUCOSE-CAPILLARY: 190 mg/dL — AB (ref 65–99)
GLUCOSE-CAPILLARY: 102 mg/dL — AB (ref 65–99)
Glucose-Capillary: 177 mg/dL — ABNORMAL HIGH (ref 65–99)

## 2015-09-07 NOTE — Progress Notes (Addendum)
Recd call from [facility] in lab. Critical troponin of 0.03 which is no change from the previous three. Paged on call physician for [facility] to advise. Dr called back to advise receiving the message at 05:15

## 2015-09-07 NOTE — Progress Notes (Signed)
Subjective:  Complaints of chest pain and states that she is just overwhelmed.  She has headache.  Concerned about the fact that she has nowhere to live.  Objective:  Vital Signs in the last 24 hours: BP 101/62 mmHg  Pulse 74  Temp(Src) 97.5 F (36.4 C) (Oral)  Resp 18  Ht 5\' 3"  (1.6 m)  Wt 99.2 kg (218 lb 11.1 oz)  BMI 38.75 kg/m2  SpO2 98%  Physical Exam: Obese black female in no acute distress but complaining of sharp chest pain. Lungs:  Clear Cardiac:  Regular rhythm, normal S1 and S2, no S3 Extremities:  No edema present  Intake/Output from previous day: 07/14 0701 - 07/15 0700 In: -  Out: 750 [Urine:750]  Weight Filed Weights   09/06/15 2121  Weight: 99.2 kg (218 lb 11.1 oz)    Lab Results: Basic Metabolic Panel:  Recent Labs  51/10/21 1000 09/06/15 2204  NA 139  --   K 3.4*  --   CL 109  --   CO2 22  --   GLUCOSE 212*  --   BUN 13  --   CREATININE 0.78 1.05*   CBC:  Recent Labs  09/06/15 1000 09/06/15 2204  WBC 6.8 6.2  HGB 12.6 12.2  HCT 38.1 37.1  MCV 82.8 84.3  PLT 267 242   Cardiac Enzymes: Troponin (Point of Care Test)  Recent Labs  09/06/15 1014  TROPIPOC 0.00   Cardiac Panel (last 3 results)  Recent Labs  09/06/15 2204 09/07/15 0024 09/07/15 0343  TROPONINI <0.03 <0.03 0.03*    Telemetry: Sinus rhythm  Assessment/Plan:  1.  Chest pain atypical for cardiac 2.  Combined chronic systolic and diastolic heart failure appears normal over the limit 3.  Left bundle branch block 4.  History of stroke 5.  Very bad social situation  Recommendations:  No additional cardiac workup is necessary at this time.  As she is homeless disposition per her primary team.  May do cardiology follow-up in the office with Dr. Duke Salvia.  Needs to have a primary care physician established in the clinic.     Kayla Palmer  MD [facility]  09/07/2015, 10:39 AM

## 2015-09-07 NOTE — Discharge Summary (Signed)
Physician Discharge Summary  Kayla Powell ZOX:[id number] DOB: 08-Feb-1956 DOA: 09/06/2015  PCP: PROVIDER NOT IN SYSTEM  Admit date: 09/06/2015 Discharge date: 09/07/2015  Time spent: 45 minutes  Recommendations for Outpatient Follow-up:  Patient will be discharged to home/shelter.  Patient will need to establish care and follow up with primary care provider.  Follow up with Dr. Duke Powell, cardiology. Patient should continue medications as prescribed.  Patient should follow a heart healthy/carb modified diet.   Discharge Diagnoses:  Chest pain Chronic systolic heart failure/NICM Essential hypertension Diabetes mellitus, type II COPD History of CVA 2 History of cocaine abuse Homelessness  Discharge Condition: stable  Diet recommendation: heart healthy/carb modified  Filed Weights   09/06/15 2121  Weight: 99.2 kg (218 lb 11.1 oz)    History of present illness:  On 09/06/2015 by Dr. Therisa Powell Kayla Powell is a 60 y.o. female with medical history significant of asthma/COPD, bipolar disorder, combined systolic and diastolic heart failure, depression, diabetes mellitus type 2, hypertension posttraumatic stress disorder and stroke 2 with residual left-sided paralysis Left bundle-branch block, cocaine abuse  Presented with chest pain after walking for long distance. She have had similar episode 3 days ago but there was mild. She called EMS and was given 2 nitroglycerin and aspirin 324 in Route. Pain was associated shortness of breath and coughing she describes it as dull and constant occasionally throb and sometimes becomes sharp. Chest pain is in the center. Reports pain is worse with palpation and worse with moving her left arm Reports last cocaine use 2 months ago.  Patient recently moved here from [facility] She is currently homeless  Hospital Course:  Chest pain -Possibly musculoskeletal. Chest pain has been ongoing for 1 year per patient. - troponin cycled and  unremarkable  -Cardiology consulted and appreciated my recommended no further cardiac workup, follow-up as an outpatient.  Chronic systolic heart failure/NICM -EF 30%, BNP minimally elevated 264 -Given IV lasix -Currently appears to be euvolemic -Continue Coreg, lisinopril, statin, Lasix  Essential hypertension -Continue lisinopril, amlodipine, Coreg, lasix  Diabetes mellitus, type II -Hemoglobin A1c pending -Continue glipizide, insulin sliding scale and Levemir  COPD -Stable, currently no wheezing. Continue home medications   History of CVA 2 -With residual left-sided paralysis  History of cocaine abuse -Used approximately 2 months ago -Tox screen positive for THC -Counseled against drug use  Homelessness -Social work/Case management consulted: Per pt, she has been in contact with her [facility] Case Manager, Research scientist (physical sciences), who has referred her to a "transitional housing shelter" operated by [employer], and that a bed is available to her at d/c. Per pt, she plans on securing her own rental property next month, and only needs somewhere to stay for "a couple of weeks." If Kayla Powell does not have a bed for pt at d/c, she will gladly accept housing at a homeless shelter. Shelter list provided. CSW will continue to follow for disposition and assist as necessary. Emotional support provided to pt.  Procedures: None  Consultations: Cardiology  Discharge Exam: Filed Vitals:   09/06/15 2121 09/07/15 0455  BP: 142/99 101/62  Pulse: 89 74  Temp: 97.4 F (36.3 C) 97.5 F (36.4 C)  Resp: 20 18     General: Well developed, well nourished, NAD, appears stated age  HEENT: NCAT, mucous membranes moist.  Cardiovascular: S1 S2 auscultated. Regular rate and rhythm.  Respiratory: Clear to auscultation bilaterally with equal chest rise  Abdomen: Soft, obese, nontender, nondistended, + bowel sounds  Extremities: warm dry without cyanosis  clubbing or edema  Neuro: AAOx3, left sided  weakness, right hand grip weak, otherwise nonfocal  Psych: Appropriate  Discharge Instructions      Discharge Instructions    Discharge instructions    Complete by:  As directed   Patient will be discharged to home/shelter.  Patient will need to establish care and follow up with primary care provider.  Follow up with Dr. Duke Powell, cardiology. Patient should continue medications as prescribed.  Patient should follow a heart healthy/carb modified diet.            Medication List    TAKE these medications        albuterol 108 (90 Base) MCG/ACT inhaler  Commonly known as:  PROVENTIL HFA;VENTOLIN HFA  Inhale 1-2 puffs into the lungs every 6 (six) hours as needed for wheezing or shortness of breath.     amitriptyline 50 MG tablet  Commonly known as:  ELAVIL  Take 50 mg by mouth at bedtime.     amLODipine 5 MG tablet  Commonly known as:  NORVASC  Take 5 mg by mouth daily.     atorvastatin 20 MG tablet  Commonly known as:  LIPITOR  Take 20 mg by mouth every evening.     baclofen 20 MG tablet  Commonly known as:  LIORESAL  Take 20 mg by mouth 3 (three) times daily.     carvedilol 3.125 MG tablet  Commonly known as:  COREG  Take 3.125 mg by mouth 2 (two) times daily with a meal.     furosemide 40 MG tablet  Commonly known as:  LASIX  Take 40 mg by mouth daily.     glipiZIDE 5 MG tablet  Commonly known as:  GLUCOTROL  Take 5 mg by mouth daily before breakfast.     insulin detemir 100 UNIT/ML injection  Commonly known as:  LEVEMIR  Inject 20 Units into the skin at bedtime.     lisinopril 20 MG tablet  Commonly known as:  PRINIVIL,ZESTRIL  Take 20 mg by mouth daily.     meclizine 25 MG tablet  Commonly known as:  ANTIVERT  Take 25 mg by mouth 3 (three) times daily as needed for dizziness.     montelukast 10 MG tablet  Commonly known as:  SINGULAIR  Take 10 mg by mouth at bedtime.     pantoprazole 40 MG tablet  Commonly known as:  PROTONIX  Take 40 mg by mouth  daily.     PARoxetine 20 MG tablet  Commonly known as:  PAXIL  Take 20 mg by mouth daily.     risperiDONE 0.25 MG tablet  Commonly known as:  RISPERDAL  Take 0.25 mg by mouth daily.       Allergies  Allergen Reactions  . Ppd [Tuberculin Purified Protein Derivative] Rash   Follow-up Information    Follow up with Chilton Si, MD.   Specialty:  Cardiology   Why:  cardiology office will contact you to arrange followup, please give [place] a call if you do not hear from [place] in 3 business days    Contact information:   [address] [place] [address] [phone]       Schedule an appointment as soon as possible for a visit with [facility] .   Why:  As needed This is your assigned Medicaid [place] access doctor If you prefer to see another Medicaid doctor other than the one on your Medicaid card PLEASE CALL DSS [phone]  [phone] or [phone]    Contact information:   Medicaid [place] access response hx indicates the assigned pcp is [facility] [address] [place], [place] [address] [phone]       Follow up with Sandi Mariscal access patient .   [phone] [address] [place], [place] [address] [email or web address] Use this website to assist with understanding your coverage & to renew application   Contact information:   As a Medicaid client you MUST contact DSS/SSI each time you change address, move to another [place] county or another state to keep your address updated  [place] Medicaid Transportation to Dr appts if you are have full Medicaid: [phone], [phone]       The results of significant diagnostics from this hospitalization (including imaging, microbiology, ancillary and laboratory) are listed below for reference.    Significant Diagnostic Studies: Dg Chest 2 View  09/06/2015  CLINICAL DATA:  Chest pain, cold chills since Tuesday EXAM: CHEST  2 VIEW  COMPARISON:  None. FINDINGS: Cardiomegaly. The lingular density, likely atelectasis or scarring. Right lung is clear. No visible effusions. No acute bony abnormality. Degenerative changes in the thoracic spine. IMPRESSION: Cardiomegaly.  Lingular atelectasis or scarring. Electronically Signed   By: Charlett Nose M.D.   On: 09/06/2015 10:54    Microbiology: No results found for this or any previous visit (from the past 240 hour(s)).   Labs: Basic Metabolic Panel:  Recent Labs Lab 09/06/15 1000 09/06/15 2204  NA 139  --   K 3.4*  --   CL 109  --   CO2 22  --   GLUCOSE 212*  --   BUN 13  --   CREATININE 0.78 1.05*  CALCIUM 9.4  --    Liver Function Tests: No results for input(s): AST, ALT, ALKPHOS, BILITOT, PROT, ALBUMIN in the last 168 hours. No results for input(s): LIPASE, AMYLASE in the last 168 hours. No results for input(s): AMMONIA in the last 168 hours. CBC:  Recent Labs Lab 09/06/15 1000 09/06/15 2204  WBC 6.8 6.2  HGB 12.6 12.2  HCT 38.1 37.1  MCV 82.8 84.3  PLT 267 242   Cardiac Enzymes:  Recent Labs Lab 09/06/15 1355 09/06/15 2204 09/07/15 0024 09/07/15 0343  TROPONINI <0.03 <0.03 <0.03 0.03*   BNP: BNP (last 3 results)  Recent Labs  09/06/15 1030  BNP 264.3*    ProBNP (last 3 results) No results for input(s): PROBNP in the last 8760 hours.  CBG:  Recent Labs Lab 09/06/15 2146 09/07/15 0623 09/07/15 1130  GLUCAP 165* 190* 102*       Signed:  Edsel Petrin  [facility] 09/07/2015, 11:48 AM

## 2015-09-07 NOTE — Progress Notes (Signed)
Discharged to home with family office visits in place teaching done  

## 2015-09-07 NOTE — Progress Notes (Signed)
Patient stated that she did not understand what DNR meant even though she told the ER physician that's what she wanted.  Wants to be changed to a Full Code. Paged [facility] physician on call to advise.

## 2015-09-07 NOTE — Discharge Instructions (Signed)

## 2015-09-09 ENCOUNTER — Telehealth: Payer: Self-pay | Admitting: *Deleted

## 2015-09-09 ENCOUNTER — Encounter: Payer: Self-pay | Admitting: Cardiovascular Disease

## 2015-09-09 LAB — HEMOGLOBIN A1C
HEMOGLOBIN A1C: 7.9 % — AB (ref 4.8–5.6)
Mean Plasma Glucose: 180 mg/dL

## 2016-01-08 ENCOUNTER — Other Ambulatory Visit: Payer: Self-pay | Admitting: Specialist

## 2016-01-08 DIAGNOSIS — Z1231 Encounter for screening mammogram for malignant neoplasm of breast: Secondary | ICD-10-CM

## 2016-07-10 ENCOUNTER — Emergency Department (HOSPITAL_COMMUNITY): Payer: Medicaid Other

## 2016-07-10 ENCOUNTER — Encounter (HOSPITAL_COMMUNITY): Payer: Self-pay | Admitting: *Deleted

## 2016-07-10 ENCOUNTER — Inpatient Hospital Stay (HOSPITAL_COMMUNITY)
Admission: EM | Admit: 2016-07-10 | Discharge: 2016-07-13 | DRG: 552 | Disposition: A | Discharge status: Rehabilitation Facility | Payer: Medicaid Other | Attending: Family Medicine | Admitting: Family Medicine

## 2016-07-10 DIAGNOSIS — F17210 Nicotine dependence, cigarettes, uncomplicated: Secondary | ICD-10-CM | POA: Diagnosis present

## 2016-07-10 DIAGNOSIS — I110 Hypertensive heart disease with heart failure: Secondary | ICD-10-CM | POA: Diagnosis present

## 2016-07-10 DIAGNOSIS — M25562 Pain in left knee: Secondary | ICD-10-CM | POA: Diagnosis present

## 2016-07-10 DIAGNOSIS — I5042 Chronic combined systolic (congestive) and diastolic (congestive) heart failure: Secondary | ICD-10-CM | POA: Diagnosis present

## 2016-07-10 DIAGNOSIS — E785 Hyperlipidemia, unspecified: Secondary | ICD-10-CM | POA: Diagnosis present

## 2016-07-10 DIAGNOSIS — F319 Bipolar disorder, unspecified: Secondary | ICD-10-CM | POA: Diagnosis present

## 2016-07-10 DIAGNOSIS — J449 Chronic obstructive pulmonary disease, unspecified: Secondary | ICD-10-CM | POA: Diagnosis present

## 2016-07-10 DIAGNOSIS — Z888 Allergy status to other drugs, medicaments and biological substances status: Secondary | ICD-10-CM

## 2016-07-10 DIAGNOSIS — M25561 Pain in right knee: Secondary | ICD-10-CM | POA: Diagnosis present

## 2016-07-10 DIAGNOSIS — S22009A Unspecified fracture of unspecified thoracic vertebra, initial encounter for closed fracture: Secondary | ICD-10-CM | POA: Diagnosis present

## 2016-07-10 DIAGNOSIS — E1165 Type 2 diabetes mellitus with hyperglycemia: Secondary | ICD-10-CM | POA: Diagnosis present

## 2016-07-10 DIAGNOSIS — I69354 Hemiplegia and hemiparesis following cerebral infarction affecting left non-dominant side: Secondary | ICD-10-CM

## 2016-07-10 DIAGNOSIS — R109 Unspecified abdominal pain: Secondary | ICD-10-CM

## 2016-07-10 DIAGNOSIS — K5900 Constipation, unspecified: Secondary | ICD-10-CM

## 2016-07-10 DIAGNOSIS — M25511 Pain in right shoulder: Secondary | ICD-10-CM | POA: Diagnosis present

## 2016-07-10 DIAGNOSIS — R262 Difficulty in walking, not elsewhere classified: Secondary | ICD-10-CM | POA: Diagnosis present

## 2016-07-10 DIAGNOSIS — M25569 Pain in unspecified knee: Secondary | ICD-10-CM

## 2016-07-10 DIAGNOSIS — I10 Essential (primary) hypertension: Secondary | ICD-10-CM | POA: Diagnosis present

## 2016-07-10 DIAGNOSIS — E119 Type 2 diabetes mellitus without complications: Secondary | ICD-10-CM

## 2016-07-10 DIAGNOSIS — S22021A Stable burst fracture of second thoracic vertebra, initial encounter for closed fracture: Principal | ICD-10-CM | POA: Diagnosis present

## 2016-07-10 DIAGNOSIS — I6930 Unspecified sequelae of cerebral infarction: Secondary | ICD-10-CM

## 2016-07-10 DIAGNOSIS — Z79899 Other long term (current) drug therapy: Secondary | ICD-10-CM

## 2016-07-10 DIAGNOSIS — I119 Hypertensive heart disease without heart failure: Secondary | ICD-10-CM | POA: Diagnosis present

## 2016-07-10 DIAGNOSIS — S22008A Other fracture of unspecified thoracic vertebra, initial encounter for closed fracture: Secondary | ICD-10-CM

## 2016-07-10 DIAGNOSIS — Z794 Long term (current) use of insulin: Secondary | ICD-10-CM

## 2016-07-10 LAB — CK: CK TOTAL: 114 U/L (ref 38–234)

## 2016-07-10 LAB — COMPREHENSIVE METABOLIC PANEL
ALBUMIN: 3.1 g/dL — AB (ref 3.5–5.0)
ALK PHOS: 119 U/L (ref 38–126)
ALT: 14 U/L (ref 14–54)
ANION GAP: 7 (ref 5–15)
AST: 21 U/L (ref 15–41)
BUN: 14 mg/dL (ref 6–20)
CHLORIDE: 108 mmol/L (ref 101–111)
CO2: 24 mmol/L (ref 22–32)
Calcium: 8.8 mg/dL — ABNORMAL LOW (ref 8.9–10.3)
Creatinine, Ser: 0.77 mg/dL (ref 0.44–1.00)
GFR calc Af Amer: >60 mL/min (ref >60)
GFR calc non Af Amer: >60 mL/min (ref >60)
GLUCOSE: 259 mg/dL — AB (ref 65–99)
POTASSIUM: 3.8 mmol/L (ref 3.5–5.1)
SODIUM: 139 mmol/L (ref 135–145)
Total Bilirubin: 0.4 mg/dL (ref 0.3–1.2)
Total Protein: 6.9 g/dL (ref 6.5–8.1)

## 2016-07-10 LAB — CBC WITH DIFFERENTIAL/PLATELET
BASOS PCT: 0 %
Basophils Absolute: 0 10*3/uL (ref 0.0–0.1)
EOS ABS: 0.2 10*3/uL (ref 0.0–0.7)
EOS PCT: 2 %
HCT: 35.6 % — ABNORMAL LOW (ref 36.0–46.0)
HEMOGLOBIN: 11.3 g/dL — AB (ref 12.0–15.0)
Lymphocytes Relative: 18 %
Lymphs Abs: 1.7 10*3/uL (ref 0.7–4.0)
MCH: 26.3 pg (ref 26.0–34.0)
MCHC: 31.7 g/dL (ref 30.0–36.0)
MCV: 82.8 fL (ref 78.0–100.0)
MONOS PCT: 5 %
Monocytes Absolute: 0.5 10*3/uL (ref 0.1–1.0)
NEUTROS PCT: 75 %
Neutro Abs: 7.2 10*3/uL (ref 1.7–7.7)
PLATELETS: 269 10*3/uL (ref 150–400)
RBC: 4.3 MIL/uL (ref 3.87–5.11)
RDW: 14.5 % (ref 11.5–15.5)
WBC: 9.6 10*3/uL (ref 4.0–10.5)

## 2016-07-10 LAB — LIPASE, BLOOD: Lipase: 28 U/L (ref 11–51)

## 2016-07-10 MED ORDER — IOPAMIDOL (ISOVUE-300) INJECTION 61%
INTRAVENOUS | Status: AC
  Administered 2016-07-10: 100 mL
  Filled 2016-07-10: qty 100

## 2016-07-10 MED ORDER — MORPHINE SULFATE (PF) 4 MG/ML IV SOLN
4.0000 mg | Freq: Once | INTRAVENOUS | Status: AC
  Administered 2016-07-10: 4 mg via INTRAVENOUS
  Filled 2016-07-10: qty 1

## 2016-07-10 MED ORDER — ONDANSETRON HCL 4 MG/2ML IJ SOLN
4.0000 mg | Freq: Once | INTRAMUSCULAR | Status: AC
  Administered 2016-07-10: 4 mg via INTRAVENOUS
  Filled 2016-07-10: qty 2

## 2016-07-10 NOTE — ED Notes (Signed)
Pt NP at bedside speaking with Dr. Ranae Palms [phone]
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
## 2016-07-11 ENCOUNTER — Encounter (HOSPITAL_COMMUNITY): Payer: Self-pay | Admitting: Family Medicine

## 2016-07-11 ENCOUNTER — Observation Stay (HOSPITAL_COMMUNITY): Payer: Medicaid Other

## 2016-07-11 DIAGNOSIS — F17210 Nicotine dependence, cigarettes, uncomplicated: Secondary | ICD-10-CM | POA: Diagnosis present

## 2016-07-11 DIAGNOSIS — Z79899 Other long term (current) drug therapy: Secondary | ICD-10-CM | POA: Diagnosis not present

## 2016-07-11 DIAGNOSIS — E785 Hyperlipidemia, unspecified: Secondary | ICD-10-CM | POA: Diagnosis present

## 2016-07-11 DIAGNOSIS — K5900 Constipation, unspecified: Secondary | ICD-10-CM | POA: Diagnosis not present

## 2016-07-11 DIAGNOSIS — M25511 Pain in right shoulder: Secondary | ICD-10-CM | POA: Diagnosis present

## 2016-07-11 DIAGNOSIS — I10 Essential (primary) hypertension: Secondary | ICD-10-CM

## 2016-07-11 DIAGNOSIS — R262 Difficulty in walking, not elsewhere classified: Secondary | ICD-10-CM

## 2016-07-11 DIAGNOSIS — J449 Chronic obstructive pulmonary disease, unspecified: Secondary | ICD-10-CM | POA: Diagnosis present

## 2016-07-11 DIAGNOSIS — S22021A Stable burst fracture of second thoracic vertebra, initial encounter for closed fracture: Secondary | ICD-10-CM | POA: Diagnosis not present

## 2016-07-11 DIAGNOSIS — I5042 Chronic combined systolic (congestive) and diastolic (congestive) heart failure: Secondary | ICD-10-CM | POA: Diagnosis present

## 2016-07-11 DIAGNOSIS — M25562 Pain in left knee: Secondary | ICD-10-CM | POA: Diagnosis present

## 2016-07-11 DIAGNOSIS — I6930 Unspecified sequelae of cerebral infarction: Secondary | ICD-10-CM | POA: Diagnosis not present

## 2016-07-11 DIAGNOSIS — J410 Simple chronic bronchitis: Secondary | ICD-10-CM | POA: Diagnosis not present

## 2016-07-11 DIAGNOSIS — E1165 Type 2 diabetes mellitus with hyperglycemia: Secondary | ICD-10-CM | POA: Diagnosis present

## 2016-07-11 DIAGNOSIS — S22029A Unspecified fracture of second thoracic vertebra, initial encounter for closed fracture: Secondary | ICD-10-CM | POA: Diagnosis not present

## 2016-07-11 DIAGNOSIS — E119 Type 2 diabetes mellitus without complications: Secondary | ICD-10-CM | POA: Diagnosis not present

## 2016-07-11 DIAGNOSIS — F319 Bipolar disorder, unspecified: Secondary | ICD-10-CM | POA: Diagnosis present

## 2016-07-11 DIAGNOSIS — Z794 Long term (current) use of insulin: Secondary | ICD-10-CM | POA: Diagnosis not present

## 2016-07-11 DIAGNOSIS — I110 Hypertensive heart disease with heart failure: Secondary | ICD-10-CM | POA: Diagnosis present

## 2016-07-11 DIAGNOSIS — S22008A Other fracture of unspecified thoracic vertebra, initial encounter for closed fracture: Secondary | ICD-10-CM | POA: Diagnosis not present

## 2016-07-11 DIAGNOSIS — S22009A Unspecified fracture of unspecified thoracic vertebra, initial encounter for closed fracture: Secondary | ICD-10-CM | POA: Diagnosis present

## 2016-07-11 DIAGNOSIS — I69354 Hemiplegia and hemiparesis following cerebral infarction affecting left non-dominant side: Secondary | ICD-10-CM | POA: Diagnosis not present

## 2016-07-11 DIAGNOSIS — M25561 Pain in right knee: Secondary | ICD-10-CM | POA: Diagnosis present

## 2016-07-11 DIAGNOSIS — Z888 Allergy status to other drugs, medicaments and biological substances status: Secondary | ICD-10-CM | POA: Diagnosis not present

## 2016-07-11 LAB — GLUCOSE, CAPILLARY
GLUCOSE-CAPILLARY: 162 mg/dL — AB (ref 65–99)
GLUCOSE-CAPILLARY: 224 mg/dL — AB (ref 65–99)
GLUCOSE-CAPILLARY: 157 mg/dL — AB (ref 65–99)
Glucose-Capillary: 104 mg/dL — ABNORMAL HIGH (ref 65–99)

## 2016-07-11 MED ORDER — INSULIN ASPART 100 UNIT/ML ~~LOC~~ SOLN
0.0000 [IU] | Freq: Every day | SUBCUTANEOUS | Status: DC
  Administered 2016-07-11: 3 [IU] via SUBCUTANEOUS

## 2016-07-11 MED ORDER — TRAMADOL HCL 50 MG PO TABS
50.0000 mg | ORAL_TABLET | Freq: Four times a day (QID) | ORAL | Status: DC | PRN
  Administered 2016-07-11 – 2016-07-13 (×8): 50 mg via ORAL
  Filled 2016-07-11 (×8): qty 1

## 2016-07-11 MED ORDER — GLIPIZIDE 5 MG PO TABS
5.0000 mg | ORAL_TABLET | Freq: Every day | ORAL | Status: DC
  Administered 2016-07-11 – 2016-07-13 (×3): 5 mg via ORAL
  Filled 2016-07-11 (×4): qty 1

## 2016-07-11 MED ORDER — FUROSEMIDE 40 MG PO TABS
40.0000 mg | ORAL_TABLET | Freq: Every day | ORAL | Status: DC
  Administered 2016-07-11 – 2016-07-13 (×3): 40 mg via ORAL
  Filled 2016-07-11 (×3): qty 1

## 2016-07-11 MED ORDER — PHENTERMINE HCL 37.5 MG PO TBDP: 37.5000 mg | ORAL_TABLET | Freq: Every day | ORAL | Status: DC

## 2016-07-11 MED ORDER — LISINOPRIL 20 MG PO TABS
20.0000 mg | ORAL_TABLET | Freq: Every day | ORAL | Status: DC
  Administered 2016-07-11 – 2016-07-13 (×3): 20 mg via ORAL
  Filled 2016-07-11 (×3): qty 1

## 2016-07-11 MED ORDER — RISPERIDONE 0.5 MG PO TABS
0.2500 mg | ORAL_TABLET | Freq: Every day | ORAL | Status: DC
  Administered 2016-07-11 – 2016-07-12 (×2): 0.25 mg via ORAL
  Filled 2016-07-11 (×2): qty 1

## 2016-07-11 MED ORDER — ACETAMINOPHEN 650 MG RE SUPP: 650.0000 mg | Freq: Four times a day (QID) | RECTAL | Status: DC | PRN

## 2016-07-11 MED ORDER — ACETAMINOPHEN 325 MG PO TABS
650.0000 mg | ORAL_TABLET | Freq: Four times a day (QID) | ORAL | Status: DC | PRN
  Administered 2016-07-13: 650 mg via ORAL
  Filled 2016-07-11: qty 2

## 2016-07-11 MED ORDER — BACLOFEN 10 MG PO TABS
20.0000 mg | ORAL_TABLET | Freq: Three times a day (TID) | ORAL | Status: DC
  Administered 2016-07-11 – 2016-07-13 (×7): 20 mg via ORAL
  Filled 2016-07-11 (×8): qty 2

## 2016-07-11 MED ORDER — MECLIZINE HCL 25 MG PO TABS: 25.0000 mg | ORAL_TABLET | Freq: Three times a day (TID) | ORAL | Status: DC | PRN

## 2016-07-11 MED ORDER — ALBUTEROL SULFATE (2.5 MG/3ML) 0.083% IN NEBU: 2.5000 mg | INHALATION_SOLUTION | Freq: Four times a day (QID) | RESPIRATORY_TRACT | Status: DC | PRN

## 2016-07-11 MED ORDER — ATORVASTATIN CALCIUM 10 MG PO TABS
20.0000 mg | ORAL_TABLET | Freq: Every evening | ORAL | Status: DC
  Administered 2016-07-11 – 2016-07-12 (×2): 20 mg via ORAL
  Filled 2016-07-11 (×2): qty 2

## 2016-07-11 MED ORDER — MONTELUKAST SODIUM 10 MG PO TABS
10.0000 mg | ORAL_TABLET | Freq: Every day | ORAL | Status: DC
  Administered 2016-07-11 – 2016-07-12 (×3): 10 mg via ORAL
  Filled 2016-07-11 (×3): qty 1

## 2016-07-11 MED ORDER — INSULIN ASPART 100 UNIT/ML ~~LOC~~ SOLN
0.0000 [IU] | Freq: Three times a day (TID) | SUBCUTANEOUS | Status: DC
  Administered 2016-07-11: 2 [IU] via SUBCUTANEOUS
  Administered 2016-07-11: 3 [IU] via SUBCUTANEOUS
  Administered 2016-07-11: 2 [IU] via SUBCUTANEOUS
  Administered 2016-07-12: 3 [IU] via SUBCUTANEOUS
  Administered 2016-07-12 – 2016-07-13 (×3): 2 [IU] via SUBCUTANEOUS

## 2016-07-11 MED ORDER — BUPROPION HCL ER (SR) 150 MG PO TB12
150.0000 mg | ORAL_TABLET | Freq: Two times a day (BID) | ORAL | Status: DC
  Administered 2016-07-11 – 2016-07-13 (×6): 150 mg via ORAL
  Filled 2016-07-11 (×6): qty 1

## 2016-07-11 MED ORDER — PAROXETINE HCL 20 MG PO TABS
20.0000 mg | ORAL_TABLET | Freq: Every day | ORAL | Status: DC
  Administered 2016-07-11 – 2016-07-13 (×3): 20 mg via ORAL
  Filled 2016-07-11 (×3): qty 1

## 2016-07-11 MED ORDER — PANTOPRAZOLE SODIUM 40 MG PO TBEC
40.0000 mg | DELAYED_RELEASE_TABLET | Freq: Every day | ORAL | Status: DC
  Administered 2016-07-11 – 2016-07-13 (×3): 40 mg via ORAL
  Filled 2016-07-11 (×3): qty 1

## 2016-07-11 MED ORDER — INSULIN DETEMIR 100 UNIT/ML ~~LOC~~ SOLN
42.0000 [IU] | Freq: Every day | SUBCUTANEOUS | Status: DC
  Administered 2016-07-11 – 2016-07-12 (×3): 42 [IU] via SUBCUTANEOUS
  Filled 2016-07-11 (×4): qty 0.42

## 2016-07-11 MED ORDER — CARVEDILOL 3.125 MG PO TABS
3.1250 mg | ORAL_TABLET | Freq: Two times a day (BID) | ORAL | Status: DC
  Administered 2016-07-11 – 2016-07-13 (×5): 3.125 mg via ORAL
  Filled 2016-07-11 (×5): qty 1

## 2016-07-11 MED ORDER — AMITRIPTYLINE HCL 25 MG PO TABS
50.0000 mg | ORAL_TABLET | Freq: Every day | ORAL | Status: DC
  Administered 2016-07-11 – 2016-07-12 (×3): 50 mg via ORAL
  Filled 2016-07-11 (×4): qty 2

## 2016-07-11 MED ORDER — AMLODIPINE BESYLATE 5 MG PO TABS
5.0000 mg | ORAL_TABLET | Freq: Every day | ORAL | Status: DC
  Administered 2016-07-11 – 2016-07-13 (×3): 5 mg via ORAL
  Filled 2016-07-11 (×3): qty 1

## 2016-07-11 MED ORDER — METHOCARBAMOL 500 MG PO TABS
500.0000 mg | ORAL_TABLET | Freq: Three times a day (TID) | ORAL | Status: DC | PRN
  Administered 2016-07-11: 500 mg via ORAL
  Filled 2016-07-11 (×2): qty 1
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
## 2016-07-12 ENCOUNTER — Inpatient Hospital Stay (HOSPITAL_COMMUNITY): Payer: Medicaid Other

## 2016-07-12 DIAGNOSIS — I5042 Chronic combined systolic (congestive) and diastolic (congestive) heart failure: Secondary | ICD-10-CM

## 2016-07-12 DIAGNOSIS — M25561 Pain in right knee: Secondary | ICD-10-CM

## 2016-07-12 DIAGNOSIS — I110 Hypertensive heart disease with heart failure: Secondary | ICD-10-CM

## 2016-07-12 DIAGNOSIS — Z794 Long term (current) use of insulin: Secondary | ICD-10-CM

## 2016-07-12 DIAGNOSIS — S22029A Unspecified fracture of second thoracic vertebra, initial encounter for closed fracture: Secondary | ICD-10-CM

## 2016-07-12 DIAGNOSIS — F319 Bipolar disorder, unspecified: Secondary | ICD-10-CM

## 2016-07-12 DIAGNOSIS — E119 Type 2 diabetes mellitus without complications: Secondary | ICD-10-CM

## 2016-07-12 LAB — GLUCOSE, CAPILLARY
GLUCOSE-CAPILLARY: 198 mg/dL — AB (ref 65–99)
GLUCOSE-CAPILLARY: 144 mg/dL — AB (ref 65–99)
Glucose-Capillary: 171 mg/dL — ABNORMAL HIGH (ref 65–99)
Glucose-Capillary: 220 mg/dL — ABNORMAL HIGH (ref 65–99)

## 2016-07-12 MED ORDER — ENOXAPARIN SODIUM 40 MG/0.4ML ~~LOC~~ SOLN
40.0000 mg | SUBCUTANEOUS | Status: DC
  Administered 2016-07-12: 40 mg via SUBCUTANEOUS
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
## 2016-07-12 NOTE — Clinical Social Work Note (Addendum)
CSW met with patient who said is currently residing in a residential substance abuse treatment center called [facility] at [facility].  Facility contacts are Mr Berenice Primas or Katharine Look at [phone], patient would prefer to return to substance abuse treatment center so she does not lose her spot, but is agreeable to going to SNF if she has to.  CSW contacted PT to see if they can reevaluate patient, due to her improved mobility according to bedside nurse and patient.  PT added patient to their priority list to see first thing Monday morning.    CSW met with patient CSW explained role of social worker and explained that PT is recommending SNF, patient is agreeable.  CSW explained to patient that there are limited amount of SNFs available who only take Medicaid, and she would also have to have her disability check turned over to SNF along with her having to stay 30 days.  Patient is still agreeable and gave CSW permission to begin bed search.    Patient is a level 2 passar number which is still pending.  Patient is not able to discharge until Passar number is received and patient has bed offers.  CSW faxed patient's information to [place], [place], [place], and [facility] snfs.  Unit social worker to continue to follow patient's progress throughout discharge planning.  Jones Broom. [place], MSW, [place]  07/12/2016 3:41 PM

## 2016-07-12 NOTE — [place] FL2 (Signed)
[place] MEDICAID FL2 LEVEL OF CARE SCREENING TOOL     IDENTIFICATION  Patient Name: Kayla Powell Birthdate: 19-Dec-1955 Sex: female Admission Date (Current Location): 07/10/2016  [place] and [place] Number:  Haynes Bast [id number] S Facility and Address:  [facility]. [facility], [address], [place], [place] [address]      Provider Number: [id number]  Attending Physician Name and Address:  Narda Bonds, MD  Relative Name and Phone Number:  Sharyne Peach [phone]     Current Level of Care: Hospital Recommended Level of Care: Skilled Nursing Facility Prior Approval Number:    Date Approved/Denied:   PASRR Number: Pending  Discharge Plan: SNF    Current Diagnoses: Patient Active Problem List   Diagnosis Date Noted  . Inability to walk 07/11/2016  . Bipolar 1 disorder (HCC) 07/11/2016  . History of stroke with residual deficit 07/11/2016  . Essential hypertension 07/11/2016  . Thoracic spine fracture (HCC) 07/11/2016  . Chronic combined systolic and diastolic heart failure (HCC) 09/06/2015  . Hypertensive heart disease 09/06/2015  . Atypical chest pain 09/06/2015  . DM type 2 (diabetes mellitus, type 2) (HCC) 09/06/2015  . Chest pain 09/06/2015  . COPD (chronic obstructive pulmonary disease) (HCC) 09/06/2015  . Prolonged QT interval 09/06/2015    Orientation RESPIRATION BLADDER Height & Weight     Self, Time, Situation, Place  Normal Continent Weight: 235 lb 6.4 oz (106.8 kg) Height:  5\' 2"  (157.5 cm)  BEHAVIORAL SYMPTOMS/MOOD NEUROLOGICAL BOWEL NUTRITION STATUS      Continent Diet (Carb Modified)  AMBULATORY STATUS COMMUNICATION OF NEEDS Skin   Limited Assist Verbally Normal                       Personal Care Assistance Level of Assistance  Bathing, Feeding, Dressing Bathing Assistance: Limited assistance Feeding assistance: Independent Dressing Assistance: Limited assistance     Functional Limitations Info  Sight,  Hearing, Speech Sight Info: Adequate Hearing Info: Adequate Speech Info: Adequate    SPECIAL CARE FACTORS FREQUENCY  PT (By licensed PT), OT (By licensed OT)     PT Frequency: Minimum 3x a week OT Frequency: Minimum 2x a week            Contractures Contractures Info: Not present    Additional Factors Info  Code Status, Allergies, Psychotropic, Insulin Sliding Scale Code Status Info: Full Code Allergies Info: PPD TUBERCULIN PURIFIED PROTEIN DERIVATIVE  Psychotropic Info: buPROPion (WELLBUTRIN SR) 12 hr tablet 150 mg and PARoxetine (PAXIL) tablet 20 mg and risperiDONE (RISPERDAL) tablet 0.25 mg Insulin Sliding Scale Info: insulin aspart (novoLOG) injection 0-9 Units 3x a day with meals.       Current Medications (07/12/2016):  This is the current hospital active medication list Current Facility-Administered Medications  Medication Dose Route Frequency Provider Last Rate Last Dose  . acetaminophen (TYLENOL) tablet 650 mg  650 mg Oral Q6H PRN Danford, Earl Lites, MD       Or  . acetaminophen (TYLENOL) suppository 650 mg  650 mg Rectal Q6H PRN Danford, Earl Lites, MD      . albuterol (PROVENTIL) (2.5 MG/3ML) 0.083% nebulizer solution 2.5 mg  2.5 mg Inhalation Q6H PRN Danford, Earl Lites, MD      . amitriptyline (ELAVIL) tablet 50 mg  50 mg Oral QHS Danford, Earl Lites, MD   50 mg at 07/11/16 2200  . amLODipine (NORVASC) tablet 5 mg  5 mg Oral Daily Danford, Earl Lites, MD   5 mg at  07/12/16 1100  . atorvastatin (LIPITOR) tablet 20 mg  20 mg Oral QPM Danford, Earl Lites, MD   20 mg at 07/11/16 1721  . baclofen (LIORESAL) tablet 20 mg  20 mg Oral TID Alberteen Sam, MD   20 mg at 07/12/16 1100  . buPROPion (WELLBUTRIN SR) 12 hr tablet 150 mg  150 mg Oral BID Alberteen Sam, MD   150 mg at 07/12/16 1100  . carvedilol (COREG) tablet 3.125 mg  3.125 mg Oral BID WC Danford, Earl Lites, MD   3.125 mg at 07/12/16 0830  . enoxaparin (LOVENOX) injection  40 mg  40 mg Subcutaneous Q24H Narda Bonds, MD      . furosemide (LASIX) tablet 40 mg  40 mg Oral Daily Danford, Earl Lites, MD   40 mg at 07/12/16 1100  . glipiZIDE (GLUCOTROL) tablet 5 mg  5 mg Oral QAC breakfast Danford, Earl Lites, MD   5 mg at 07/12/16 0830  . insulin aspart (novoLOG) injection 0-5 Units  0-5 Units Subcutaneous QHS Alberteen Sam, MD   3 Units at 07/11/16 0222  . insulin aspart (novoLOG) injection 0-9 Units  0-9 Units Subcutaneous TID WC Alberteen Sam, MD   2 Units at 07/12/16 0705  . insulin detemir (LEVEMIR) injection 42 Units  42 Units Subcutaneous QHS Alberteen Sam, MD   42 Units at 07/11/16 2200  . lisinopril (PRINIVIL,ZESTRIL) tablet 20 mg  20 mg Oral Daily Danford, Earl Lites, MD   20 mg at 07/12/16 1100  . meclizine (ANTIVERT) tablet 25 mg  25 mg Oral TID PRN Alberteen Sam, MD      . montelukast (SINGULAIR) tablet 10 mg  10 mg Oral QHS Danford, Earl Lites, MD   10 mg at 07/11/16 2200  . pantoprazole (PROTONIX) EC tablet 40 mg  40 mg Oral Daily Danford, Earl Lites, MD   40 mg at 07/12/16 1100  . PARoxetine (PAXIL) tablet 20 mg  20 mg Oral Daily Danford, Earl Lites, MD   20 mg at 07/12/16 1100  . risperiDONE (RISPERDAL) tablet 0.25 mg  0.25 mg Oral Daily Danford, Earl Lites, MD   0.25 mg at 07/12/16 1100  . traMADol (ULTRAM) tablet 50 mg  50 mg Oral Q6H PRN Alberteen Sam, MD   50 mg at 07/12/16 1159     Discharge Medications: Please see discharge summary for a list of discharge medications.  Relevant Imaging Results:  Relevant Lab Results:   Additional Information SSN [id number]  Darleene Cleaver, [place]

## 2016-07-12 NOTE — Clinical Social Work Note (Signed)
Clinical Social Work Assessment  Patient Details  Name: Kayla Powell MRN: [id number] Date of Birth: 1956/02/06  Date of referral:  07/12/16               Reason for consult:  Facility Placement                Permission sought to share information with:  Facility Medical sales representative, Family Supports Permission granted to share information::  Yes, Verbal Permission Granted  Name::     Sharyne Peach [phone]   Agency::  SNF admissions  Relationship::     Contact Information:     Housing/Transportation Living arrangements for the past 2 months:  Independent Living Facility Source of Information:  Patient Patient Interpreter Needed:  None Criminal Activity/Legal Involvement Pertinent to Current Situation/Hospitalization:  No - Comment as needed Significant Relationships:  Adult Children Lives with:  Facility Resident Do you feel safe going back to the place where you live?  Yes Need for family participation in patient care:  No (Coment)  Care giving concerns:  Patient expressed that she feels like she is able to return back to [place] to continue with her substance abuse treatment plan, however PT is recommending SNF.   Social Worker assessment / plan:  Patient is a 61 year old female who is currently residing at [facility] which is a residential treatment center for substance abuse.  Patient expressed she feels like she is much stronger then yesterday and would like to be reevaluated by PT.  Patient states she has not been to SNF before CSW explained what the process is and what to expect at SNF.  Patient was informed that because she only has Medicaid, there are very few facilities that can accept Medicaid only.  CSW explained that patient may have to go out of county for placement, and she would have to stay for 30 days and also have to sign over her SSI check while she is at SNF to help pay for stay.  Patient expressed understanding and did not have any other  questions or concerns.  Patient gave CSW permission to begin bed search process in [place], [place], [place], and [place] counties.    Employment status:  Disabled (Comment on whether or not currently receiving Disability) Insurance information:  Medicaid In [place] PT Recommendations:  Skilled Nursing Facility Information / Referral to community resources:  Skilled Nursing Facility  Patient/Family's Response to care:  Patient was concerned that if she goes to SNF for short term rehab, she may lose her spot at [facility] substance abuse treatment center where she has been staying.  CSW attempted to contact [facility] at [phone] but they are not open today.    Patient/Family's Understanding of and Emotional Response to Diagnosis, Current Treatment, and Prognosis:  Patient expressed that she is hopeful that PT will say she can return to the treatment program to continue working on her treatment for substance abuse.  Emotional Assessment Appearance:  Appears stated age Attitude/Demeanor/Rapport:    Affect (typically observed):  Appropriate, Calm Orientation:  Oriented to Place, Oriented to  Time, Oriented to Self, Oriented to Situation Alcohol / Substance use:  Not Applicable Psych involvement (Current and /or in the community):  No (Comment)  Discharge Needs  Concerns to be addressed:  Lack of Support Readmission within the last 30 days:  No Current discharge risk:  Lack of support system Barriers to Discharge:  Awaiting Secretary/administrator Psychologist, educational)  [place] Constable 07/12/2016, 3:51 PM

## 2016-07-12 NOTE — Plan of Care (Signed)
Problem: Education: Goal: Knowledge of disease or condition will improve Outcome: Progressing Patient educated about primary prevention methods and s/s of hypo and hyper glycemia this shift. Will continue with cares   Problem: Physical Regulation: Goal: Pain level will decrease Outcome: Progressing Patient pain managed with tramadol. Patient satisfied with pain management. Will continue to monitor.

## 2016-07-13 ENCOUNTER — Inpatient Hospital Stay (HOSPITAL_COMMUNITY): Payer: Medicaid Other

## 2016-07-13 DIAGNOSIS — K5900 Constipation, unspecified: Secondary | ICD-10-CM

## 2016-07-13 LAB — GLUCOSE, CAPILLARY
GLUCOSE-CAPILLARY: 101 mg/dL — AB (ref 65–99)
Glucose-Capillary: 151 mg/dL — ABNORMAL HIGH (ref 65–99)

## 2016-07-13 MED ORDER — POLYETHYLENE GLYCOL 3350 17 G PO PACK
17.0000 g | PACK | Freq: Every day | ORAL | Status: DC
  Administered 2016-07-13: 17 g via ORAL
  Filled 2016-07-13: qty 1

## 2016-07-13 MED ORDER — POLYETHYLENE GLYCOL 3350 17 G PO PACK: 17.0000 g | PACK | Freq: Every day | ORAL | 0 refills | Status: AC

## 2016-07-13 MED ORDER — BISACODYL 10 MG RE SUPP
10.0000 mg | Freq: Once | RECTAL | Status: DC
  Filled 2016-07-13: qty 1

## 2016-07-13 NOTE — Plan of Care (Signed)
Problem: Pain Managment: Goal: General experience of comfort will improve Outcome: Progressing Patient pain managed with PRN tramadol, Patient satisfied, will continue with care.  Problem: Education: Goal: Knowledge of disease or condition will improve Outcome: Progressing Diabetic teaching reinforced. Patient encouraged to adhere to diabetic diet.   Problem: Physical Regulation: Goal: Pain level will decrease Outcome: Progressing Patient pain controled with PRN tramadol, patient denies pain and discomfort, no prn pain medication administerd this shift so far.

## 2016-07-13 NOTE — Progress Notes (Signed)
Patient ready for discharge from the hospital; discharge instructions given and reviewed; patient dressed and discharged out via wheelchair with her equipment; transported via taxi to her Rehab facility.

## 2016-07-13 NOTE — Progress Notes (Signed)
Inpatient Diabetes Program Recommendations  AACE/ADA: New Consensus Statement on Inpatient Glycemic Control (2015)  Target Ranges:  Prepandial:   less than 140 mg/dL      Peak postprandial:   less than 180 mg/dL (1-2 hours)      Critically ill patients:  140 - 180 mg/dL   Lab Results  Component Value Date   GLUCAP 101 (H) 07/13/2016   HGBA1C 7.9 (H) 09/07/2015    Review of Glycemic Control Inpatient Diabetes Program Recommendations:  Please consider A1c to determine prehospital glycemic control. Will follow.  Thank you, Billy Fischer. Johnica Armwood, RN, MSN, CDE  Diabetes Coordinator Inpatient Glycemic Control Team Team Pager [phone] (8am-5pm) 07/13/2016 9:49 AM

## 2016-07-13 NOTE — Discharge Instructions (Signed)
Vertebral Fracture A vertebral fracture is a break in one of the bones that make up the spine (vertebrae). The vertebrae are stacked on top of each other to form the spinal column. They support the body and protect the spinal cord. The vertebral column has an upper part (cervical spine), a middle part (thoracic spine), and a lower part (lumbar spine). Most vertebral fractures occur in the thoracic spine or lumbar spine. There are three main types of vertebral fractures:  Flexion fracture. This happens when vertebrae collapse. Vertebrae can collapse:  In the front (compression fracture). This type of fracture is common in people who have a condition that causes their bones to be weak and brittle (osteoporosis). The fracture can make a person lose height.  In the front and back (axial burst fracture).  Extension fracture. This happens when an external force pulls apart the vertebrae.  Rotation fracture. This happens when the spine bends extremely in one direction. This type can cause a piece of a vertebra to break off (transverse process fracture) or move out of its normal position (fracture dislocation). This type of fracture has a high risk for spinal cord injury. Vertebral fractures can range from mild to very severe. The most severe types are those that cause the broken bones to move out of place (unstable) and those that injure or press on the spinal cord. What are the causes? This condition is usually caused by a forceful injury. This type of injury commonly results from:  Car accidents.  Falling or jumping from a great height.  Collisions in contact sports.  Violent acts, such as an assault or a gunshot wound. What increases the risk? This injury is more likely to happen to people who:  Have osteoporosis.  Participate in contact sports.  Are in situations that could result in falls or other violent injuries. What are the signs or symptoms? Symptoms of this injury depend on the  location and the type of fracture. The most common symptom is back pain that gets worse with movement. You may also have trouble standing or walking. If a fracture has damaged your spinal cord or is pressing on it, you may also have:  Numbness.  Tingling.  Weakness.  Loss of movement.  Loss of bowel or bladder control. How is this diagnosed? This injury may be diagnosed based on symptoms, medical history, and a physical exam. You may also have imaging tests to confirm the diagnosis. These may include:  Spine X-ray.  CT scan.  MRI. How is this treated? Treatment for this injury depends on the type of fracture. If your fracture is stable and does not affect your spinal cord, it may heal with nonsurgical treatment, such as:  Taking pain medicine.  Wearing a cast or a brace.  Doing physical therapy exercises. If your vertebral fracture is unstable or it affects your spinal cord, you may need surgical treatment, such as:  Laminectomy. This procedure involves removing the part of a vertebra that is pushing on the spinal cord (spinal decompression surgery). Bone fragments may also be removed.  Spinal fusion. This procedure is used to stabilize an unstable fracture. Vertebrae may be joined together with a piece of bone from another part of your body (graft) and held in place with rods, plates, or screws.  Vertebroplasty. In this procedure, bone cement is used to rebuild collapsed vertebrae. Follow these instructions at home: General instructions  Take medicines only as directed by your health care provider.  Do not drive or   operate heavy machinery while taking pain medicine.  If directed, apply ice to the injured area:  Put ice in a plastic bag.  Place a towel between your skin and the bag.  Leave the ice on for 30 minutes every two hours at first. Then apply the ice as needed.  Wear your neck brace or back brace as directed by your health care provider.  Do not drink  alcohol. Alcohol can interfere with your treatment.  Keep all follow-up visits as directed by your health care provider. This is important. It can help to prevent permanent injury, disability, and long-lasting (chronic) pain. Activity  Stay in bed (on bed rest) only as directed by your health care provider. Being on bed rest for too long can make your condition worse.  Return to your normal activities as directed by your health care provider. Ask what activities are safe for you.  Do exercises to improve motion and strength in your back (physical therapy), as recommended by your health care provider.  Exercise regularly as directed by your health care provider. Contact a health care provider if:  You have a fever.  You develop a cough that makes your pain worse.  Your pain medicine is not helping.  Your pain does not get better over time.  You cannot return to your normal activities as planned or expected. Get help right away if:  Your pain is very bad and it suddenly gets worse.  You are unable to move any body part (paralysis) that is below the level of your injury.  You have numbness, tingling, or weakness in any body part that is below the level of your injury.  You cannot control your bladder or bowels. This information is not intended to replace advice given to you by your health care provider. Make sure you discuss any questions you have with your health care provider. Document Released: 03/19/2004 Document Revised: 07/24/2015 Document Reviewed: 02/14/2014 Elsevier Interactive Patient Education  2017 [employer]  
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
## 2016-07-13 NOTE — Care Management Note (Signed)
Case Management Note  Patient Details  Name: Kayla Powell MRN: [id number] Date of Birth: 09-07-1955  Subjective/Objective:                    Action/Plan: Pt discharging back to the inpatient residential drug rehab. Patients family to provide transportation. Pt with orders for rollator. [facility] did not have any available on site. CM provided the patient with the orders for the rollator and she will have her family stop and pick it up on the way to the facility. CM provided her the location of the [employer]. Pt verbalized understanding.   Expected Discharge Date:                  Expected Discharge Plan:  IP Rehab Facility (Drug rehab facility)  In-House Referral:  Clinical Social Work  Discharge planning Services  CM Consult  Post Acute Care Choice:  Durable Medical Equipment Choice offered to:  Patient  DME Arranged:  Walker rolling with seat DME Agency:     HH Arranged:    HH Agency:     Status of Service:  Completed, signed off  If discussed at [employer], dates discussed:    Additional Comments:  Kermit Balo, RN 07/13/2016, 12:02 PM
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
## 2016-07-13 NOTE — Care Management (Signed)
Pt does not have transportation home. CM went and picked up patients rollator at [facility]. CSW was consulted for cab voucher to rehab.

## 2016-07-13 NOTE — Progress Notes (Signed)
Discharge to: [facility] Anticipated discharge date: 07/13/16 Transportation by: Taxi  CSW signing off.  Blenda Nicely LCSW [phone]

## 2016-08-06 ENCOUNTER — Encounter (HOSPITAL_COMMUNITY): Payer: Self-pay | Admitting: Emergency Medicine

## 2016-08-06 ENCOUNTER — Ambulatory Visit (HOSPITAL_COMMUNITY)
Admission: EM | Admit: 2016-08-06 | Discharge: 2016-08-06 | Disposition: A | Discharge status: Home / Self Care | Payer: Medicaid Other | Attending: Internal Medicine | Admitting: Internal Medicine

## 2016-08-06 ENCOUNTER — Emergency Department (HOSPITAL_COMMUNITY)
Admission: EM | Admit: 2016-08-06 | Discharge: 2016-08-06 | Disposition: A | Discharge status: Home / Self Care | Payer: Medicaid Other | Attending: Emergency Medicine | Admitting: Emergency Medicine

## 2016-08-06 ENCOUNTER — Emergency Department (HOSPITAL_COMMUNITY): Payer: Medicaid Other

## 2016-08-06 DIAGNOSIS — S22029D Unspecified fracture of second thoracic vertebra, subsequent encounter for fracture with routine healing: Secondary | ICD-10-CM | POA: Diagnosis not present

## 2016-08-06 DIAGNOSIS — F17210 Nicotine dependence, cigarettes, uncomplicated: Secondary | ICD-10-CM | POA: Insufficient documentation

## 2016-08-06 DIAGNOSIS — E119 Type 2 diabetes mellitus without complications: Secondary | ICD-10-CM | POA: Diagnosis not present

## 2016-08-06 DIAGNOSIS — Y939 Activity, unspecified: Secondary | ICD-10-CM | POA: Insufficient documentation

## 2016-08-06 DIAGNOSIS — J45909 Unspecified asthma, uncomplicated: Secondary | ICD-10-CM | POA: Diagnosis not present

## 2016-08-06 DIAGNOSIS — S22028D Other fracture of second thoracic vertebra, subsequent encounter for fracture with routine healing: Secondary | ICD-10-CM

## 2016-08-06 DIAGNOSIS — S1190XA Unspecified open wound of unspecified part of neck, initial encounter: Secondary | ICD-10-CM

## 2016-08-06 DIAGNOSIS — M79672 Pain in left foot: Secondary | ICD-10-CM | POA: Insufficient documentation

## 2016-08-06 DIAGNOSIS — S22029A Unspecified fracture of second thoracic vertebra, initial encounter for closed fracture: Secondary | ICD-10-CM | POA: Diagnosis not present

## 2016-08-06 DIAGNOSIS — Y929 Unspecified place or not applicable: Secondary | ICD-10-CM | POA: Diagnosis not present

## 2016-08-06 DIAGNOSIS — I110 Hypertensive heart disease with heart failure: Secondary | ICD-10-CM | POA: Diagnosis not present

## 2016-08-06 DIAGNOSIS — S0180XA Unspecified open wound of other part of head, initial encounter: Secondary | ICD-10-CM | POA: Diagnosis not present

## 2016-08-06 DIAGNOSIS — Z8673 Personal history of transient ischemic attack (TIA), and cerebral infarction without residual deficits: Secondary | ICD-10-CM | POA: Insufficient documentation

## 2016-08-06 DIAGNOSIS — I5042 Chronic combined systolic (congestive) and diastolic (congestive) heart failure: Secondary | ICD-10-CM | POA: Diagnosis not present

## 2016-08-06 DIAGNOSIS — S1189XA Other open wound of other specified part of neck, initial encounter: Secondary | ICD-10-CM | POA: Diagnosis not present

## 2016-08-06 DIAGNOSIS — Y998 Other external cause status: Secondary | ICD-10-CM | POA: Insufficient documentation

## 2016-08-06 DIAGNOSIS — Z794 Long term (current) use of insulin: Secondary | ICD-10-CM | POA: Insufficient documentation

## 2016-08-06 DIAGNOSIS — Z79899 Other long term (current) drug therapy: Secondary | ICD-10-CM | POA: Insufficient documentation

## 2016-08-06 MED ORDER — CLINDAMYCIN HCL 150 MG PO CAPS
450.0000 mg | ORAL_CAPSULE | Freq: Once | ORAL | Status: AC
  Administered 2016-08-06: 450 mg via ORAL
  Filled 2016-08-06: qty 3

## 2016-08-06 MED ORDER — CLINDAMYCIN HCL 150 MG PO CAPS: 450.0000 mg | ORAL_CAPSULE | Freq: Three times a day (TID) | ORAL | 0 refills | Status: AC

## 2016-08-06 NOTE — Discharge Instructions (Signed)
Please go to the ED

## 2016-08-06 NOTE — Discharge Instructions (Addendum)
Please take antibiotics as prescribed. Follow up with your primary care provider within one 1 week for wound re-check and persistent foot pain. Your primary care provider can help you change your wound dressing during this visit.   Your foot x-ray did not show fractures or dislocations. Please wear post op shoe for at least 1 week or until no longer painful.  Take ibuprofen or tylenol for pain. Ice and elevate your foot.   Return to ED for worsening of wound and/or fever, pus like drainage  Follow up with Dr Yetta Barre as scheduled

## 2016-08-06 NOTE — ED Notes (Signed)
Patient transported to X-ray 
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
## 2016-08-06 NOTE — ED Notes (Signed)
Was adv by Lincoln Brigham, RN that pt was sent to ED for further eval by shuttle.
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
## 2016-08-06 NOTE — ED Provider Notes (Signed)
CSN: [id number]     Arrival date & time 08/06/16  1005 History   First MD Initiated Contact with Patient 08/06/16 1047     Chief Complaint  Patient presents with  . Wound Check   (Consider location/radiation/quality/duration/timing/severity/associated sxs/prior Treatment) Patient c/o neck discomfort and neck wound.  She is wearing a [place] Collar due to T2 fracture and she was seen by Dr. Yetta Barre Neurosurgery.   The history is provided by the patient.  Wound Check  This is a new problem. The problem occurs constantly. The problem has not changed since onset.Nothing aggravates the symptoms. Nothing relieves the symptoms. She has tried nothing for the symptoms.    Past Medical History:  Diagnosis Date  . Arthritis   . Asthma   . Atypical chest pain 09/06/2015  . CHF (congestive heart failure) (HCC)   . Chronic combined systolic and diastolic heart failure (HCC) 09/06/2015  . COPD (chronic obstructive pulmonary disease) (HCC)   . Diabetes mellitus without complication (HCC)   . Hypertension   . Hypertensive heart disease 09/06/2015  . Stroke [facility])    Past Surgical History:  Procedure Laterality Date  . CHOLECYSTECTOMY    . DENTAL SURGERY    . SP ARTHRO THUMB*R*    . TUBAL LIGATION     Family History  Problem Relation Age of Onset  . Heart failure Mother   . Hypertension Mother   . Cancer Mother   . Cancer Father   . Hypertension Father   . Stroke Brother   . Diabetes Brother   . Heart failure Brother    Social History  Substance Use Topics  . Smoking status: Current Every Day Smoker    Types: Cigarettes  . Smokeless tobacco: Never Used  . Alcohol use Yes     Comment: occasional   OB History    No data available     Review of Systems  Constitutional: Negative.   HENT: Negative.   Eyes: Negative.   Respiratory: Negative.   Cardiovascular: Negative.   Gastrointestinal: Negative.   Endocrine: Negative.   Genitourinary: Negative.   Musculoskeletal:  Positive for arthralgias.  Skin: Positive for wound.  Allergic/Immunologic: Negative.   Neurological: Negative.   Hematological: Negative.   Psychiatric/Behavioral: Negative.     Allergies  Ppd [tuberculin purified protein derivative]  Home Medications   Prior to Admission medications   Medication Sig Start Date End Date Taking? Authorizing Provider  albuterol (PROVENTIL HFA;VENTOLIN HFA) 108 (90 Base) MCG/ACT inhaler Inhale 1-2 puffs into the lungs every 6 (six) hours as needed for wheezing or shortness of breath.   Yes [provider]  amitriptyline (ELAVIL) 50 MG tablet Take 50 mg by mouth at bedtime.   Yes [provider]  amLODipine (NORVASC) 5 MG tablet Take 5 mg by mouth daily.   Yes [provider]  atorvastatin (LIPITOR) 20 MG tablet Take 20 mg by mouth every evening.   Yes [provider]  baclofen (LIORESAL) 20 MG tablet Take 20 mg by mouth 3 (three) times daily.   Yes [provider]  buPROPion (WELLBUTRIN SR) 150 MG 12 hr tablet Take 150 mg by mouth 2 (two) times daily.   Yes [provider]  carvedilol (COREG) 3.125 MG tablet Take 3.125 mg by mouth 2 (two) times daily with a meal.   Yes [provider]  furosemide (LASIX) 40 MG tablet Take 40 mg by mouth daily.   Yes [provider]  glipiZIDE (GLUCOTROL) 5 MG tablet Take  5 mg by mouth daily before breakfast.   Yes [provider]  insulin detemir (LEVEMIR) 100 UNIT/ML injection Inject 42 Units into the skin at bedtime.    Yes [provider]  lisinopril (PRINIVIL,ZESTRIL) 20 MG tablet Take 20 mg by mouth daily.   Yes [provider]  montelukast (SINGULAIR) 10 MG tablet Take 10 mg by mouth at bedtime.   Yes [provider]  pantoprazole (PROTONIX) 40 MG tablet Take 40 mg by mouth daily.   Yes [provider]  PARoxetine (PAXIL) 20 MG tablet Take 20 mg by mouth daily.   Yes [provider]   risperiDONE (RISPERDAL) 0.25 MG tablet Take 0.25 mg by mouth daily.   Yes [provider]  traMADol (ULTRAM) 50 MG tablet Take 50 mg by mouth every 6 (six) hours as needed for moderate pain.   Yes [provider]  meclizine (ANTIVERT) 25 MG tablet Take 25 mg by mouth 3 (three) times daily as needed for dizziness.    [provider]  Phentermine HCl 37.5 MG TBDP Take 37.5 mg by mouth daily.    [provider]  polyethylene glycol (MIRALAX / GLYCOLAX) packet Take 17 g by mouth daily. 07/14/16   Narda Bonds, MD   Meds Ordered and Administered this Visit  Medications - No data to display  BP (!) 147/93 (BP Location: Left Arm)   Pulse 99   Temp 98.7 F (37.1 C) (Oral)   Resp 20   SpO2 100%  No data found.   Physical Exam  Constitutional: She is oriented to person, place, and time. She appears well-developed and well-nourished.  HENT:  Head: Normocephalic and atraumatic.  Eyes: Conjunctivae and EOM are normal. Pupils are equal, round, and reactive to light.  Neck:  Patient wearing [place] Cervical Collar.  Cardiovascular: Normal rate, regular rhythm and normal heart sounds.   Pulmonary/Chest: Effort normal and breath sounds normal.  Neurological: She is alert and oriented to person, place, and time.  Skin:  Skin Breakdown under chin on neck.  Nursing note and vitals reviewed.   Urgent Care Course     Procedures (including critical care time)  Labs Review Labs Reviewed - No data to display  Imaging Review No results found.   Visual Acuity Review  Right Eye Distance:   Left Eye Distance:   Bilateral Distance:    Right Eye Near:   Left Eye Near:    Bilateral Near:         MDM   1. Closed fracture of second thoracic vertebra with routine healing, unspecified fracture morphology, subsequent encounter   2. Open wound of neck, initial encounter    Called Dr. Yetta Barre office and explained findings and Dr. Yetta Barre  Is in  Surgery and office recommends patient be seen in the ED.     Deatra Canter, [place] 08/06/16 [phone]
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
## 2016-08-06 NOTE — ED Notes (Signed)
Pt ambulated to restroom from room, tolerated well. 

## 2016-08-20 ENCOUNTER — Encounter (HOSPITAL_BASED_OUTPATIENT_CLINIC_OR_DEPARTMENT_OTHER): Payer: Medicaid Other | Attending: Internal Medicine

## 2016-08-20 DIAGNOSIS — E1140 Type 2 diabetes mellitus with diabetic neuropathy, unspecified: Secondary | ICD-10-CM | POA: Diagnosis not present

## 2016-08-20 DIAGNOSIS — I10 Essential (primary) hypertension: Secondary | ICD-10-CM | POA: Diagnosis not present

## 2016-08-20 DIAGNOSIS — F17210 Nicotine dependence, cigarettes, uncomplicated: Secondary | ICD-10-CM | POA: Insufficient documentation

## 2016-08-20 DIAGNOSIS — Z794 Long term (current) use of insulin: Secondary | ICD-10-CM | POA: Insufficient documentation

## 2016-08-20 DIAGNOSIS — J449 Chronic obstructive pulmonary disease, unspecified: Secondary | ICD-10-CM | POA: Diagnosis not present

## 2016-08-20 DIAGNOSIS — Z8673 Personal history of transient ischemic attack (TIA), and cerebral infarction without residual deficits: Secondary | ICD-10-CM | POA: Diagnosis not present

## 2016-08-20 DIAGNOSIS — L89892 Pressure ulcer of other site, stage 2: Secondary | ICD-10-CM | POA: Diagnosis not present

## 2016-08-27 ENCOUNTER — Encounter (HOSPITAL_BASED_OUTPATIENT_CLINIC_OR_DEPARTMENT_OTHER): Payer: Medicaid Other | Attending: Internal Medicine

## 2016-08-27 DIAGNOSIS — I10 Essential (primary) hypertension: Secondary | ICD-10-CM | POA: Diagnosis not present

## 2016-08-27 DIAGNOSIS — J449 Chronic obstructive pulmonary disease, unspecified: Secondary | ICD-10-CM | POA: Diagnosis not present

## 2016-08-27 DIAGNOSIS — E119 Type 2 diabetes mellitus without complications: Secondary | ICD-10-CM | POA: Diagnosis not present

## 2016-08-27 DIAGNOSIS — Z8673 Personal history of transient ischemic attack (TIA), and cerebral infarction without residual deficits: Secondary | ICD-10-CM | POA: Insufficient documentation

## 2016-08-27 DIAGNOSIS — L89892 Pressure ulcer of other site, stage 2: Secondary | ICD-10-CM | POA: Insufficient documentation

## 2016-09-03 DIAGNOSIS — L89892 Pressure ulcer of other site, stage 2: Secondary | ICD-10-CM | POA: Diagnosis not present

## 2016-09-17 DIAGNOSIS — L89892 Pressure ulcer of other site, stage 2: Secondary | ICD-10-CM | POA: Diagnosis not present

## 2016-11-18 ENCOUNTER — Other Ambulatory Visit: Payer: Self-pay | Admitting: Student

## 2016-11-18 DIAGNOSIS — S22029A Unspecified fracture of second thoracic vertebra, initial encounter for closed fracture: Secondary | ICD-10-CM

## 2016-12-03 ENCOUNTER — Ambulatory Visit: Payer: Self-pay | Admitting: Surgery

## 2016-12-10 ENCOUNTER — Ambulatory Visit
Admission: RE | Admit: 2016-12-10 | Discharge: 2016-12-10 | Disposition: A | Discharge status: Home / Self Care | Payer: Medicaid Other | Source: Ambulatory Visit | Attending: Student | Admitting: Student

## 2016-12-10 ENCOUNTER — Other Ambulatory Visit: Payer: Self-pay

## 2016-12-10 DIAGNOSIS — S22029A Unspecified fracture of second thoracic vertebra, initial encounter for closed fracture: Secondary | ICD-10-CM

## 2017-01-07 ENCOUNTER — Encounter (HOSPITAL_BASED_OUTPATIENT_CLINIC_OR_DEPARTMENT_OTHER): Payer: No Typology Code available for payment source

## 2017-01-28 ENCOUNTER — Encounter: Payer: Self-pay | Admitting: Specialist
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
## 2018-11-30 ENCOUNTER — Other Ambulatory Visit: Payer: Self-pay

## 2018-11-30 ENCOUNTER — Encounter (HOSPITAL_COMMUNITY): Payer: Self-pay

## 2018-11-30 ENCOUNTER — Emergency Department (HOSPITAL_COMMUNITY): Payer: Medicaid Other

## 2018-11-30 ENCOUNTER — Emergency Department (HOSPITAL_COMMUNITY)
Admission: EM | Admit: 2018-11-30 | Discharge: 2018-11-30 | Disposition: A | Discharge status: Home / Self Care | Payer: Medicaid Other | Attending: Emergency Medicine | Admitting: Emergency Medicine

## 2018-11-30 DIAGNOSIS — Y929 Unspecified place or not applicable: Secondary | ICD-10-CM | POA: Insufficient documentation

## 2018-11-30 DIAGNOSIS — F17210 Nicotine dependence, cigarettes, uncomplicated: Secondary | ICD-10-CM | POA: Insufficient documentation

## 2018-11-30 DIAGNOSIS — S39012A Strain of muscle, fascia and tendon of lower back, initial encounter: Secondary | ICD-10-CM | POA: Insufficient documentation

## 2018-11-30 DIAGNOSIS — E119 Type 2 diabetes mellitus without complications: Secondary | ICD-10-CM | POA: Insufficient documentation

## 2018-11-30 DIAGNOSIS — J45909 Unspecified asthma, uncomplicated: Secondary | ICD-10-CM | POA: Insufficient documentation

## 2018-11-30 DIAGNOSIS — Z8673 Personal history of transient ischemic attack (TIA), and cerebral infarction without residual deficits: Secondary | ICD-10-CM | POA: Diagnosis not present

## 2018-11-30 DIAGNOSIS — R42 Dizziness and giddiness: Secondary | ICD-10-CM | POA: Diagnosis not present

## 2018-11-30 DIAGNOSIS — Y999 Unspecified external cause status: Secondary | ICD-10-CM | POA: Insufficient documentation

## 2018-11-30 DIAGNOSIS — S161XXA Strain of muscle, fascia and tendon at neck level, initial encounter: Secondary | ICD-10-CM | POA: Diagnosis not present

## 2018-11-30 DIAGNOSIS — Z794 Long term (current) use of insulin: Secondary | ICD-10-CM | POA: Insufficient documentation

## 2018-11-30 DIAGNOSIS — Y939 Activity, unspecified: Secondary | ICD-10-CM | POA: Diagnosis not present

## 2018-11-30 DIAGNOSIS — Z79899 Other long term (current) drug therapy: Secondary | ICD-10-CM | POA: Insufficient documentation

## 2018-11-30 DIAGNOSIS — M25561 Pain in right knee: Secondary | ICD-10-CM | POA: Insufficient documentation

## 2018-11-30 DIAGNOSIS — R519 Headache, unspecified: Secondary | ICD-10-CM | POA: Diagnosis present

## 2018-11-30 DIAGNOSIS — I110 Hypertensive heart disease with heart failure: Secondary | ICD-10-CM | POA: Diagnosis not present

## 2018-11-30 DIAGNOSIS — I5042 Chronic combined systolic (congestive) and diastolic (congestive) heart failure: Secondary | ICD-10-CM | POA: Insufficient documentation

## 2018-11-30 DIAGNOSIS — Y92410 Unspecified street and highway as the place of occurrence of the external cause: Secondary | ICD-10-CM | POA: Diagnosis not present

## 2018-11-30 MED ORDER — HYDROCODONE-ACETAMINOPHEN 5-325 MG PO TABS: 1.0000 | ORAL_TABLET | ORAL | 0 refills | Status: AC | PRN
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
## 2018-11-30 NOTE — Discharge Instructions (Addendum)
Warm compresses to sore muscles for 20 minutes at a time. Take Norco as needed as prescribed for pain, do not drive rapid machinery when taking Norco. Take Colace if taking the Norco to prevent constipation. Follow-up with your primary care provider this week and return to ER for new or worsening symptoms.

## 2018-11-30 NOTE — ED Notes (Signed)
Patient verbalizes understanding of discharge instructions. Opportunity for questioning and answers were provided. Armband removed by staff, pt discharged from ED ambulatory.
